# Patient Record
Sex: Male | Born: 1975 | Race: White | Hispanic: No | Marital: Married | Smoking: Former smoker
Health system: Southern US, Academic
[De-identification: ages and names within clinical notes are randomized; demographics above are authoritative.]

## PROBLEM LIST (undated history)

## (undated) ENCOUNTER — Encounter (HOSPITAL_COMMUNITY): Payer: Self-pay

## (undated) ENCOUNTER — Ambulatory Visit (HOSPITAL_COMMUNITY): Payer: Self-pay

## (undated) DIAGNOSIS — C8190 Hodgkin lymphoma, unspecified, unspecified site: Secondary | ICD-10-CM

## (undated) HISTORY — PX: HX LAP CHOLECYSTECTOMY: SHX56

## (undated) HISTORY — DX: Hodgkin lymphoma, unspecified, unspecified site (CMS HCC): C81.90

## (undated) HISTORY — PX: MEDIPORT INSERTION, SINGLE: SHX2027

## (undated) HISTORY — PX: FINGER SURGERY: SHX640

## (undated) SURGERY — IR INFUSAPORT PLACEMENT
Laterality: Right

---

## 1998-10-18 ENCOUNTER — Ambulatory Visit (HOSPITAL_COMMUNITY): Payer: Self-pay | Admitting: EXTERNAL

## 2020-01-12 ENCOUNTER — Ambulatory Visit: Payer: 59

## 2020-01-29 ENCOUNTER — Ambulatory Visit (HOSPITAL_BASED_OUTPATIENT_CLINIC_OR_DEPARTMENT_OTHER): Payer: 59

## 2020-01-29 ENCOUNTER — Ambulatory Visit: Payer: 59 | Attending: Orthopaedic Surgery | Admitting: Orthopaedic Surgery

## 2020-01-29 ENCOUNTER — Encounter (INDEPENDENT_AMBULATORY_CARE_PROVIDER_SITE_OTHER): Payer: Self-pay | Admitting: Orthopaedic Surgery

## 2020-01-29 ENCOUNTER — Other Ambulatory Visit: Payer: Self-pay

## 2020-01-29 VITALS — BP 138/92 | HR 86 | Temp 98.1°F | Ht 69.0 in | Wt 200.2 lb

## 2020-01-29 DIAGNOSIS — M545 Low back pain, unspecified: Secondary | ICD-10-CM

## 2020-01-29 DIAGNOSIS — M4316 Spondylolisthesis, lumbar region: Secondary | ICD-10-CM

## 2020-01-29 DIAGNOSIS — M5416 Radiculopathy, lumbar region: Secondary | ICD-10-CM

## 2020-01-30 NOTE — H&P (Signed)
PATIENT NAME: [name] NUMBER:  [id number]  DATE OF SERVICE: 01/29/2020  DATE OF BIRTH:  [birth year]    HISTORY AND PHYSICAL    CHIEF COMPLAINT:  Low back pain.    HISTORY OF PRESENT ILLNESS:  Jason Huffman is a 44 year old male with no significant past medical history who presents with severe low back pain that started on December 13, 2019. He remembers an event where he was lifting a tire to put back put back into his truck when his pain started.  When he was walking, he felt a popping sensation in his back and started to have sharp and excruciating pain in his lower back radiating into his right hip and down into his right shin skipping the anterior aspect of his knee. He was also having significant weakness and was limited by the pain and could not walk for 5 days. He went into the Emergency Department where he was given pain medicine, which helped his symptoms minimally and was sent home.  Over the course of the next 6 weeks his pain slowly improved and today his pain is significantly improved and has gone from a 10/10 to 3/10 on the pain scale.  He started taking his Neurontin at higher doses which significantly helped his pain and his numbness and tingling in his right leg.  He has also been stretching and trying to be as active as possible and started undergoing physical therapy, his first session which he says helped him significantly.  His symptoms would be made worse with sitting down or lying down for long periods of time.  He would also have difficulty standing for long periods of time as he had significant right leg pain when he would stand especially when he was extending his back.  He had to actually ambulate in his house with in a wheelie chair in order to get around because he was unable to walk significant amounts of distances.  He also received Demerol and Phenergan injection in the Emergency Department, which did not help.  He has not had any epidural steroid injections or nerve root  blocks.  Overall, his symptoms have significantly improved over the course of 6 weeks and he feels that it is at a tolerable level.  He comes in today primarily asking for advice on when he can start back with his normal activities given that he is a very active person that likes to work out as often as possible.    PAST MEDICAL HISTORY:  Notable for bacterial meningitis in around 2010-2011 treated with the IV antibiotics with no deficits, gastroesophageal reflux disease.    PAST SURGICAL HISTORY:  Cholecystectomy in 2013.  No problems with anesthesia.    MEDICATIONS:  1. Gabapentin.   2. Flexeril.   3. Prilosec 20 mg daily.    ALLERGIES:  No known drug allergies.    SOCIAL HISTORY:  Scientist, forensic, Engineer, technical sales, and Dealer.  He last worked on December 13, 2019.  He has been unable to work since. He is trying to get back to work actively.  Denies any tobacco use.  Denies any alcohol use.    FAMILY HISTORY:  Positive for back problems in his father.  Positive for back surgery in 8 family members.  Father had heart disease, CHF, open-heart surgery, quintuple bypass, MI x2, cancer.  Mother had diabetes, heart disease, stroke, high blood pressure, bilateral below-knee amputations, CHF, open-heart surgery, and triple bypass, MI x4.    REVIEW OF SYSTEMS:  Positive for right leg numbness and trouble beginning to urinae and beginning a bowel movement due to the pain since his injury on December 13, 2019, otherwise negative other than what was mentioned in the HPI.    PHYSICAL EXAMINATION:  Vitals:  BP is 138/92, pulse is 86, temperature is 36.7 degrees Celsius, weight is 90.8, height is 5 feet 9 inches.  General:  Resting in exam room chair, in no acute distress.  HEENT:  Normocephalic, atraumatic.  Respiratory:  Nonlabored respirations on exam.  Abdomen:  Soft, nontender, nondistended.  MSK exam:  Spine exam, gait is normal on exam.  He is able to perform tiptoe walking, heel walking, and tandem gait with no issues.  Cervical  and lumbar range of motion are full, 5/5 strength bilaterally to deltoid, biceps, triceps, wrist extension, finger flexion, and hand intrinsic muscles.  Sensation is intact to light touch in the C5 and T1 distribution, 5/5 strength bilaterally to hip flexion, knee extension, ankle dorsiflexion, great toe extension, ankle plantarflexion.  Sensation is intact to light touch in an L2-S1 distribution.  Biceps, triceps, brachioradialis reflexes are 2+ on exam.  Negative Hoffmann.  Negative scapulohumeral reflexes bilaterally.  Patellar and Achilles reflexes are 2+.  Negative clonus.  Babinski is downgoing bilaterally, nontender to palpation over all of the cervical, thoracic and lumbar regions of the spine.    IMAGING:  MRI of the lumbosacral spine was reviewed today with Dr. Layne Benton.  Per our interpretation there is a herniated nucleus pulposus at the L3-L4 disk impinging on the right L4 traversing nerve root as a posterior lateral herniated disk.  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 X-rays were also reviewed which showed no acute fracture dislocation noted.    ASSESSMENT AND PLAN:  Jason Huffman is a 44 year old male who presents with a right L4 radiculopathy type history, physical examination, and imaging findings. Given his resolution of his symptoms, at this time, conservative management is indicated.  We will continue his physical therapy as well as his medication regimen and advised him to gradually increase his activity as tolerated.  We also educated him on red flag signs and symptoms to come in right away if he were to experience any bowel or bladder incontinence.  We do believe that this will get better over time as it has already started to improve significantly. The L5-S1 isthmic spondylolisthesis has likely been there for a very long time and the patient was advised that if this were to start bothering him in the  future, that there would be options for treating out as well.  Otherwise, the patient can follow up on an as-needed basis and continue the current management that he has.  He was instructed to gradually increase his activity as tolerated.  The patient is in agreement with the plan.  All questions and concerns were answered.      Jason Delay, MD    I saw and examined the patient.  I reviewed the resident's note.  I agree with the findings and plan of care as documented in the resident's note.  Any exceptions/additions are edited/noted.     Terald Sleeper, MD  Professor   [facility]           CC:   Adriana Mccallum, MD   Fax: [phone]       DD:  01/29/2020 18:44:01  DT:  01/30/2020 06:41:20  RO  D#:  [id number]

## 2021-07-27 ENCOUNTER — Encounter (HOSPITAL_BASED_OUTPATIENT_CLINIC_OR_DEPARTMENT_OTHER): Payer: Self-pay | Admitting: Internal Medicine

## 2021-08-04 ENCOUNTER — Other Ambulatory Visit (HOSPITAL_BASED_OUTPATIENT_CLINIC_OR_DEPARTMENT_OTHER): Payer: Self-pay | Admitting: Internal Medicine

## 2021-08-04 ENCOUNTER — Inpatient Hospital Stay (HOSPITAL_BASED_OUTPATIENT_CLINIC_OR_DEPARTMENT_OTHER)
Admission: RE | Admit: 2021-08-04 | Discharge: 2021-08-04 | Disposition: A | Discharge status: Home / Self Care | Payer: 59 | Source: Ambulatory Visit

## 2021-08-04 ENCOUNTER — Other Ambulatory Visit: Payer: Self-pay

## 2021-08-04 ENCOUNTER — Ambulatory Visit (HOSPITAL_BASED_OUTPATIENT_CLINIC_OR_DEPARTMENT_OTHER): Payer: 59

## 2021-08-04 ENCOUNTER — Ambulatory Visit: Payer: 59 | Attending: Internal Medicine | Admitting: Internal Medicine

## 2021-08-04 ENCOUNTER — Encounter (HOSPITAL_BASED_OUTPATIENT_CLINIC_OR_DEPARTMENT_OTHER): Payer: Self-pay | Admitting: Internal Medicine

## 2021-08-04 VITALS — BP 126/84 | HR 70 | Temp 97.6°F | Ht 69.0 in | Wt 201.1 lb

## 2021-08-04 DIAGNOSIS — Z8571 Personal history of Hodgkin lymphoma: Secondary | ICD-10-CM | POA: Insufficient documentation

## 2021-08-04 DIAGNOSIS — R0789 Other chest pain: Secondary | ICD-10-CM | POA: Insufficient documentation

## 2021-08-04 DIAGNOSIS — C8190 Hodgkin lymphoma, unspecified, unspecified site: Secondary | ICD-10-CM

## 2021-08-04 DIAGNOSIS — L299 Pruritus, unspecified: Secondary | ICD-10-CM | POA: Insufficient documentation

## 2021-08-04 DIAGNOSIS — Z7952 Long term (current) use of systemic steroids: Secondary | ICD-10-CM | POA: Insufficient documentation

## 2021-08-04 DIAGNOSIS — Z87891 Personal history of nicotine dependence: Secondary | ICD-10-CM | POA: Insufficient documentation

## 2021-08-04 LAB — COMPREHENSIVE METABOLIC PANEL, NON-FASTING
ALBUMIN: 3.8 g/dL (ref 3.5–5.0)
ALKALINE PHOSPHATASE: 97 U/L (ref 45–115)
ALT (SGPT): 14 U/L (ref 10–55)
ANION GAP: 10 mmol/L (ref 4–13)
AST (SGOT): 21 U/L (ref 8–45)
BILIRUBIN TOTAL: 0.3 mg/dL (ref 0.3–1.3)
BUN/CREA RATIO: 13 (ref 6–22)
BUN: 11 mg/dL (ref 8–25)
CALCIUM: 9.5 mg/dL (ref 8.5–10.0)
CHLORIDE: 103 mmol/L (ref 96–111)
CO2 TOTAL: 23 mmol/L (ref 22–30)
CREATININE: 0.88 mg/dL (ref 0.75–1.35)
ESTIMATED GFR: >90 mL/min/BSA (ref >=60)
GLUCOSE: 91 mg/dL (ref 65–125)
POTASSIUM: 4.1 mmol/L (ref 3.5–5.1)
PROTEIN TOTAL: 7.5 g/dL (ref 6.0–7.9)
SODIUM: 136 mmol/L (ref 136–145)

## 2021-08-04 LAB — CBC WITH DIFF
BASOPHIL #: <0.1 10*3/uL (ref <=0.20)
BASOPHIL %: 1 %
EOSINOPHIL #: 0.63 10*3/uL — ABNORMAL HIGH (ref <=0.50)
EOSINOPHIL %: 5 %
HCT: 42.4 % (ref 38.9–52.0)
HGB: 14.2 g/dL (ref 13.4–17.5)
IMMATURE GRANULOCYTE #: <0.1 10*3/uL (ref <0.10)
IMMATURE GRANULOCYTE %: 0 % (ref 0–1)
LYMPHOCYTE #: 0.54 10*3/uL — ABNORMAL LOW (ref 1.00–4.80)
LYMPHOCYTE %: 5 %
MCH: 27.3 pg (ref 26.0–32.0)
MCHC: 33.5 g/dL (ref 31.0–35.5)
MCV: 81.5 fL (ref 78.0–100.0)
MONOCYTE #: 0.75 10*3/uL (ref 0.20–1.10)
MONOCYTE %: 6 %
NEUTROPHIL #: 9.84 10*3/uL — ABNORMAL HIGH (ref 1.50–7.70)
NEUTROPHIL %: 83 %
PLATELETS: 308 10*3/uL (ref 150–400)
RBC: 5.2 10*6/uL (ref 4.50–6.10)
RDW-CV: 13.4 % (ref 11.5–15.5)
WBC: 11.9 10*3/uL — ABNORMAL HIGH (ref 3.7–11.0)

## 2021-08-04 LAB — LDH: LDH: 238 U/L — ABNORMAL HIGH (ref 125–220)

## 2021-08-04 LAB — HIV1/HIV2 SCREEN, COMBINED ANTIGEN AND ANTIBODY: HIV SCREEN, COMBINED ANTIGEN & ANTIBODY: NEGATIVE

## 2021-08-04 LAB — PLATELETS AND ANC CANCER CENTER
NEUTROPHILS #: 9.84 10*3/uL — ABNORMAL HIGH (ref 1.50–7.70)
PLATELET COUNT: 308 10*3/uL (ref 150–400)

## 2021-08-04 LAB — HEPATITIS B CORE ANTIBODY: HBV CORE TOTAL ANTIBODIES: NEGATIVE

## 2021-08-04 LAB — HEPATITIS B SURFACE ANTIBODY: HBV SURFACE ANTIBODY QUANTITATIVE: 130 m[IU]/mL — ABNORMAL HIGH (ref <8)

## 2021-08-04 LAB — HEPATITIS B SURFACE ANTIGEN: HBV SURFACE ANTIGEN QUALITATIVE: NEGATIVE

## 2021-08-04 LAB — SEDIMENTATION RATE: ERYTHROCYTE SEDIMENTATION RATE (ESR): 11 mm/hr (ref 0–15)

## 2021-08-04 LAB — HEPATITIS C ANTIBODY SCREEN WITH REFLEX TO HCV PCR: HCV ANTIBODY QUALITATIVE: NEGATIVE

## 2021-08-04 MED ORDER — DEXAMETHASONE 4 MG TABLET
4.0000 mg | ORAL_TABLET | Freq: Every day | ORAL | 0 refills | Status: DC
Start: 2021-08-04 — End: 2021-08-18

## 2021-08-04 NOTE — Nurses Notes (Signed)
New Patient Visit - [facility]      Diagnosis:  Hodgkin's [place] disease specific written information provided and verbally reviewed.  Patient assessed for barriers to learning, language, learning preference, and teaching needs.      Patient completed psychosocial distress screening using the Enhanced NCCN Distress Thermometer (DT) Tool and self-reported an overall score of 3/10. Self-reported scores of 6 and above, including any identified practical, family or emotional concerns, were sent to the [facility] [employer]' pool for review and management. Any concerns in regards to physical symptoms or treatment decisions were sent to the physician for review via [place].     [place] telephone triage brochure and magnet provided with verbal instructions on how to access this support should the patient develop symptoms, side effects and when necessary go to a local emergency room.    Verbally reviewed that appointments may be rescheduled but missed appointments will be followed up with a phone call or letter from our clinic. Provided a written copy of [facility]'s and [facility]'s appointment policies.    Oriented to the [facility]. Verbally reviewed support services offered at the [facility]: home medical supply service, chaplain, psychosocial/supportive care clinic, registered dietician, social workers, pharmacy specialists, financial counselors.        Caregiver Education  Patient has a Caregiver: Yes    Patient Caregiver:  Spouse Marye Round)    Caregiver phone number: [phone]    Patient gives verbal permission to the [facility] clinic staff to discuss medical information with the caregiver: yes    Caregiver has been assessed for barriers to learning, language, learning preference, and teaching needs.      [facility] Caring for the Caregiver and When Someone You Love Is Being Treated for Cancer was verbally  reviewed and provided a copy to caregiver-no, no treatment at this time.  Will get treatment at [facility] was engaged during the caregiver education.    Feedback received from caregiver was a verbal understanding of treatment plan.    Patient to be referred to [facility] for remainder of workup and treatment.  Patient and wife verbalized understanding of plan.       Adora Fridge, RN  08/04/2021, 13:49

## 2021-08-04 NOTE — Cancer Center Note (Signed)
[facility] Patient History and Physical      Date: 08/04/2021  Name: Jason Huffman  MRN: [id number]  Referring Physician: System, Referring Not In  Primary Care Provider: Adriana Mccallum, DO    Reason for visit/consultation New Patient      History of Present Illness:  Information Obtained from: patient and spouse  Pt is a 46 year old man who presents for evaluation of Hodgkin Lymphoma.  He noted that for the past year he hasn't been feeling right, that his energy hasn't been good.  He also started itching, and saw multiple doctors about the itching and received medications and creams for it.  In the fall he developed intermittent chest pain, which worried him because both his parents died young of heart disease. In November started his evaluation and ultimately had a CT chest, which showed a large mediastinal mass, 12 cm, with associated lymphadenopathy.  He had a biopsy done, which showed classical Hodgkin Lymphoma.  He denies fevers, chills, night sweats.  His main complaints are fatigue, itching, and intermittent chest pain, which is sharp and positional.    Past Medical History  Past Medical History:   Diagnosis Date   . Hodgkin lymphoma (CMS HCC)            No Known Allergies  Current Outpatient Medications   Medication Sig   . busPIRone (BUSPAR) 5 mg Oral Tablet Take 1 Tablet (5 mg total) by mouth Twice daily   . cyclobenzaprine (FLEXERIL) 10 mg Oral Tablet Take 1 Tablet (10 mg total) by mouth Three times a day   . dexAMETHasone (DECADRON) 4 mg Oral Tablet Take 1 Tablet (4 mg total) by mouth Once a day for 7 days   . gabapentin (NEURONTIN) 300 mg Oral Capsule Take 300 mg by mouth (Patient not taking: Reported on 08/04/2021)   . hydrOXYzine HCL (ATARAX) 25 mg Oral Tablet Take 1 Tablet (25 mg total) by mouth Three times a day   . Ibuprofen (MOTRIN) 600 mg Oral Tablet Take 1 Tablet (600 mg total) by mouth Four times a day as needed for Pain   . omeprazole (PRILOSEC) 20 mg Oral Capsule, Delayed  Release(E.C.) Take 1 Capsule (20 mg total) by mouth Once a day   . zolpidem (AMBIEN) 10 mg Oral Tablet Take 1 Tablet (10 mg total) by mouth Every night       Family History  Family Medical History:     Problem Relation (Age of Onset)    Coronary Artery Disease Mother, Father          Social History  Occupation:   Social History     Occupational History   . Occupation: School bus driver    Hometown: [place] [address]   reports that he quit smoking about 16 years ago. His smoking use included cigarettes. He has never used smokeless tobacco. He reports that he does not drink alcohol and does not use drugs.  ROS: Other than ROS in the HPI, all other systems were negative.    Physical Examination:  Most Recent [facility] Office Visit from 08/04/2021 in Hematology/Oncology, [facility]   Temperature 36.4 C (97.6 F) filed at... 08/04/2021 1047   Heart Rate 70 filed at... 08/04/2021 1047   Respiratory Rate --   BP (Non-Invasive) 126/84 filed at... 08/04/2021 1047   SpO2 100 % filed at... 08/04/2021 1047   Height 1.753 m (_0 ) filed at... 08/04/2021 1047  Weight 91.2 kg (201 lb 1 oz) filed at... 08/04/2021 1047   BMI (Calculated) 29.75 filed at... 08/04/2021 1047   BSA (Calculated) 2.11 filed at... 08/04/2021 1047      ECOG Status: 0 - Fully active, able to carry on all pre-disease performance without restriction.     General: appears in good health  Eyes: Conjunctiva clear.  Neck: no adenopathy  Lungs: breathing comfortably  Cardiovascular: Regular rate and rhythm  Abdomen: non-distended  Extremities: no cyanosis or edema  Skin: excoriations  Neurologic: grossly normal  Lymphatics: No nodes appreciated  Psychiatric: AOx3  Access:  No current access site    Labs  Results for orders placed or performed during the hospital encounter of 08/04/21 (from the past 36 hour(s))   COMPREHENSIVE METABOLIC PANEL, NON-FASTING   Result Value Ref Range    SODIUM 136 136 - 145 mmol/L    POTASSIUM 4.1  3.5 - 5.1 mmol/L    CHLORIDE 103 96 - 111 mmol/L    CO2 TOTAL 23 22 - 30 mmol/L    ANION GAP 10 4 - 13 mmol/L    BUN 11 8 - 25 mg/dL    CREATININE 0.88 0.75 - 1.35 mg/dL    BUN/CREA RATIO 13 6 - 22    ESTIMATED GFR >90 >=60 mL/min/BSA    ALBUMIN 3.8 3.5 - 5.0 g/dL     CALCIUM 9.5 8.5 - 10.0 mg/dL    GLUCOSE 91 65 - 125 mg/dL    ALKALINE PHOSPHATASE 97 45 - 115 U/L    ALT (SGPT) 14 10 - 55 U/L    AST (SGOT)  21 8 - 45 U/L    BILIRUBIN TOTAL 0.3 0.3 - 1.3 mg/dL    PROTEIN TOTAL 7.5 6.0 - 7.9 g/dL   BLOOD CELL COUNT W/DIFF - CANCER CENTER    Narrative    The following orders were created for panel order BLOOD CELL COUNT W/DIFF - CANCER CENTER.  Procedure                               Abnormality         Status                     ---------                               -----------         ------                     CBC WITH ULAG[[id number]]                Abnormal            Final result               PLATELETS AND ANC CANCER.Marland KitchenMarland Kitchen[[id number]]  Abnormal            Final result                 Please view results for these tests on the individual orders.   LDH   Result Value Ref Range    LDH 238 (H) 125 - 220 U/L   SEDIMENTATION RATE   Result Value Ref Range    ERYTHROCYTE SEDIMENTATION RATE (ESR) 11 0 - 15 mm/hr   HIV1/HIV2 SCREEN, COMBINED ANTIGEN AND ANTIBODY   Result  Value Ref Range    HIV SCREEN, COMBINED ANTIGEN & ANTIBODY Negative Negative   Hepatitis C Antibody   Result Value Ref Range    HCV ANTIBODY QUALITATIVE Negative Negative   Hepatitis B Surface Antibody   Result Value Ref Range    HBV SURFACE ANTIBODY QUANTITATIVE 130 (H) <8 mIU/mL    Hepatitis B Surface Antigen   Result Value Ref Range    HBV SURFACE ANTIGEN QUALITATIVE Negative Negative   HEPATITIS B CORE ANTIBODY   Result Value Ref Range    HBV CORE TOTAL ANTIBODIES Negative Negative   CBC WITH DIFF   Result Value Ref Range    WBC 11.9 (H) 3.7 - 11.0 x10^3/uL    RBC 5.20 4.50 - 6.10 x10^6/uL    HGB 14.2 13.4 - 17.5 g/dL    HCT 42.4 38.9 - 52.0 %    MCV  81.5 78.0 - 100.0 fL    MCH 27.3 26.0 - 32.0 pg    MCHC 33.5 31.0 - 35.5 g/dL    RDW-CV 13.4 11.5 - 15.5 %    PLATELETS 308 150 - 400 x10^3/uL    NEUTROPHIL % 83 %    LYMPHOCYTE % 5 %    MONOCYTE % 6 %    EOSINOPHIL % 5 %    BASOPHIL % 1 %    NEUTROPHIL # 9.84 (H) 1.50 - 7.70 x10^3/uL    LYMPHOCYTE # 0.54 (L) 1.00 - 4.80 x10^3/uL    MONOCYTE # 0.75 0.20 - 1.10 x10^3/uL    EOSINOPHIL # 0.63 (H) <=0.50 x10^3/uL    BASOPHIL # <0.10 <=0.20 x10^3/uL    IMMATURE GRANULOCYTE % 0 0 - 1 %    IMMATURE GRANULOCYTE # <0.10 <0.10 x10^3/uL    Narrative    Mean Platelet Volume - Not Reported  Platelet Count -Analyzer flag indicated platelet clumps- actual count may be higher.   PLATELETS AND ANC CANCER CENTER   Result Value Ref Range    NEUTROPHILS # 9.84 (H) 1.50 - 7.70 x10^3/uL    PLATELET COUNT 308 150 - 400 x10^3/uL     Pathology:  Slide reviewed at [facility]: pending      Radiology  Outside CT chest reviewed by me, large anterior mediastinal mass    Assessment/Plan:   Pt is a 46 year old man who presents for evaluation of Hodgkin lymphoma.    1. Hodgkin lymphoma: Today we discussed a normal healthy blood system and the derangements that happen in HL.  His path has not been received here yet for review, but per outside records it is classical HL.  He will need echo and PFT's, which he can get locally.  He appears to have stage IIAX disease, but with his large mass and chest pain that sounds like pericardial invasion, I would treat with 6 cycles of PET-adapted ABVD, followed by radiation to site of bulk.  If his PFT's are abnormal, he can get 6 cycles of AAVD instead.    2. Chest pain: Seems like pericarditis from local mass.  Prescribed dexamethasone 4 mg po daily x 7 days until he can get started on treatment.    3. Itching: Due to underlying HL, should improve with treatment.    This was a 60 minute visit, more than half of which was spent in counseling on HL.    RTC prn.      Katheren Puller, MD    CC:  Adriana Mccallum, DO  [place]  [id number]    No primary care provider on file.

## 2021-08-05 ENCOUNTER — Encounter (HOSPITAL_BASED_OUTPATIENT_CLINIC_OR_DEPARTMENT_OTHER): Payer: Self-pay | Admitting: Internal Medicine

## 2021-08-05 ENCOUNTER — Encounter (HOSPITAL_BASED_OUTPATIENT_CLINIC_OR_DEPARTMENT_OTHER): Payer: Self-pay

## 2021-08-05 ENCOUNTER — Other Ambulatory Visit: Payer: 59 | Attending: Hematology & Oncology

## 2021-08-05 DIAGNOSIS — Z710 Person encountering health services to consult on behalf of another person: Secondary | ICD-10-CM

## 2021-08-05 DIAGNOSIS — C8179 Other classical Hodgkin lymphoma, extranodal and solid organ sites: Secondary | ICD-10-CM

## 2021-08-05 NOTE — Progress Notes (Signed)
Late entry for 08/04/21:  MSW responding to patient's Enhanced NCCN distress tool screening performed at visit on date 08/04/21. Patient self-reported a score of 3. Identified treatment decisions and finances as concern. Patient was referred to Oncology Social Work. Met with pt/wife at request of MD Ross.  Provided information about social work services available as well as information from LLS to review.  Pt will receive treatment closer to home and did not have additional concerns or questions at this time.  Patient provided with MSW contact information for any future needs.

## 2021-08-07 ENCOUNTER — Other Ambulatory Visit (HOSPITAL_BASED_OUTPATIENT_CLINIC_OR_DEPARTMENT_OTHER): Payer: Self-pay | Admitting: Internal Medicine

## 2021-08-07 ENCOUNTER — Other Ambulatory Visit (HOSPITAL_COMMUNITY): Payer: Self-pay

## 2021-08-07 DIAGNOSIS — C8190 Hodgkin lymphoma, unspecified, unspecified site: Secondary | ICD-10-CM | POA: Insufficient documentation

## 2021-08-08 ENCOUNTER — Other Ambulatory Visit (HOSPITAL_BASED_OUTPATIENT_CLINIC_OR_DEPARTMENT_OTHER): Payer: Self-pay | Admitting: Internal Medicine

## 2021-08-08 NOTE — Progress Notes (Signed)
MULTIDISCIPLINARY MLS TUMOR BOARD DISCUSSION:    PATIENT:  Jason Huffman  MRN:  [id number]  DOB:   Jan 04, 1976  AGE:   46 y.o.  DATE:  08/08/2021    REFERRING PROVIDER: No ref. provider found  PCP: Adriana Mccallum, DO    PRESENTER: Katheren Puller, MD  TYPE OF PRESENTATION: Prospective  PATHOLOGY REVIEWED AT [facility]?: No  RADIOLOGY REVIEWED AT [facility]?:  No  NATIONAL GUIDELINES DISCUSSED?: Yes; NCCN    DIAGNOSIS: Hodgkin Lymphoma  DIAGNOSIS DATE: February 2023  DIAGNOSIS LOCATION:  Outside Facility  DIAGNOSIS METHOD: Biopsy  STAGE: Stage IIAX  RECURRENT/ RELAPSE/ REFRACTORY: No    FAMILY HISTORY?: Not Significant  GENETIC TESTING?: No  PROGNOSTIC INDICATORS: Yes    CLINICAL TRIAL PARTICIPATION: No  ELIGIBLE CLINICAL TRIALS: No    NEED FOR PALLIATIVE CARE?:  No  PSYCHOSOCIAL CONCERNS?:   No  REHABILITATION CONCERNS?:   No  NUTRITIONAL CONCERNS?: No  DENTAL/ORAL SURGERY?: No    PATIENT NAVIGATOR DISCUSSED OUTREACH ACTIVITIES/PROGRAMS?: Yes  PATIENT NAVIGATOR PROVIDED EDUCATIONAL MATERIAL TO PATIENT?: Yes    THE PATIENT'S MOST RECENT CLINICAL INFORMATION, IMAGING, AND PATHOLOGY WAS DISCUSSED TODAY AT [facility] MULTIDISCIPLINARY MLS TUMOR BOARD BY THE MEDICAL ONCOLOGY, RADIATION ONCOLOGY, PATHOLOGY, PHARMACY, DATA, SOCIAL SERVICES, GENETIC, REHAB, AND NURSING SERVICES.  RECOMMENDATIONS BASED ON TODAY'S TUMOR BOARD:   -Obtain echo and PFT  -Complete 6C of ABVD  -Refer to rad-onc for RT to site of bulk    York Pellant 08/08/2021 14:57  Oncology Tumor Board Coordinator

## 2021-08-11 ENCOUNTER — Telehealth (HOSPITAL_COMMUNITY): Payer: Self-pay

## 2021-08-11 NOTE — Patient Instructions (Signed)
Interventional Radiology has contacted the patient and given the following instructions:    You have an appointment in Interventional Radiology on 08/21/21 at 6:30am for port placement.  Please arrive 15 minutes prior to your appointment time and check in at the Registration Desk in the [place] [facility].     Your instructions are:  Fast for 8 hours prior to your arrival. You may have clear liquids up until 2 hours prior to procedure.  Morning medications may be taken with water.  Medication instructions: None, patient not on diabetic medications or blood thinners. Continue all prescription medications.  You will be given moderate sedation. You must have a driver available for transportation at discharge.     Please call Interventional Radiology at [phone] option [phone] if you have any further questions or concerns.

## 2021-08-12 ENCOUNTER — Encounter (HOSPITAL_BASED_OUTPATIENT_CLINIC_OR_DEPARTMENT_OTHER): Payer: Self-pay | Admitting: Internal Medicine

## 2021-08-12 ENCOUNTER — Other Ambulatory Visit (HOSPITAL_BASED_OUTPATIENT_CLINIC_OR_DEPARTMENT_OTHER): Payer: Self-pay | Admitting: Internal Medicine

## 2021-08-12 MED ORDER — DEXAMETHASONE 4 MG TABLET
4.0000 mg | ORAL_TABLET | Freq: Every day | ORAL | 0 refills | Status: DC
Start: 2021-08-12 — End: 2021-10-17

## 2021-08-12 NOTE — Telephone Encounter (Signed)
decadron

## 2021-08-15 ENCOUNTER — Inpatient Hospital Stay
Admission: RE | Admit: 2021-08-15 | Discharge: 2021-08-15 | Disposition: A | Discharge status: Home / Self Care | Payer: 59 | Source: Ambulatory Visit | Attending: Cardiovascular Disease | Admitting: Cardiovascular Disease

## 2021-08-15 ENCOUNTER — Other Ambulatory Visit (HOSPITAL_BASED_OUTPATIENT_CLINIC_OR_DEPARTMENT_OTHER): Payer: Self-pay | Admitting: Internal Medicine

## 2021-08-15 ENCOUNTER — Inpatient Hospital Stay (HOSPITAL_BASED_OUTPATIENT_CLINIC_OR_DEPARTMENT_OTHER)
Admission: RE | Admit: 2021-08-15 | Discharge: 2021-08-15 | Disposition: A | Discharge status: Home / Self Care | Payer: 59 | Source: Ambulatory Visit

## 2021-08-15 ENCOUNTER — Other Ambulatory Visit: Payer: Self-pay

## 2021-08-15 DIAGNOSIS — R942 Abnormal results of pulmonary function studies: Secondary | ICD-10-CM

## 2021-08-15 DIAGNOSIS — C8190 Hodgkin lymphoma, unspecified, unspecified site: Secondary | ICD-10-CM

## 2021-08-15 LAB — ONCO-CARDIOLOGY TRANSTHORACIC ECHO
AV LVOT peak gradient: 7 mmHg
AV mean gradient: 4 mmHg
Ao VTI: 24.2 cm
Ao root annulus: 3.5 cm
Ao root annulus: 3.5 cm
Aortic Valve Area by Continuity of VTI: 3.65 cm2
Ascending aorta: 3.5 cm
E wave decelartion time: 253 ms
E/A ratio: 1.1
E/E' ratio: 6.3
LA Volume Index: 21.6 ml/m2
LA volume: 30.5 cm3
LVOT diameter: 2.1 cm
LVOT stroke volume: 88 cm3
Left Ventricular Outflow Tract Peak Velocity: 128 cm/s
MV Peak A Vel: 64.9 cm/s
MV Peak E Vel: 72.8 cm/s
MV stenosis pressure 1/2 time: 74 ms
Pulmonic Valve Acceleration Time: 94 ms
RVDD: 3.7 cm
RVDD: 3.7 cm
TDI: 11.6 cm/s
TDI: 11.6 cm/s

## 2021-08-15 MED ORDER — DEXTROSE 5% IN WATER (D5W) FLUSH BAG - 250 ML
INTRAVENOUS | Status: DC | PRN
Start: 2021-08-15 — End: 2021-08-16

## 2021-08-15 MED ORDER — SODIUM CHLORIDE 0.9 % (FLUSH) INJECTION SYRINGE
2.0000 mL | INJECTION | Freq: Three times a day (TID) | INTRAMUSCULAR | Status: DC
Start: 2021-08-15 — End: 2021-08-16

## 2021-08-15 MED ORDER — SODIUM CHLORIDE 0.9% FLUSH BAG - 250 ML
INTRAVENOUS | Status: AC | PRN
Start: 2021-08-15 — End: ?

## 2021-08-15 MED ORDER — SODIUM CHLORIDE 0.9 % INJECTION SOLUTION
2.0000 mL | INTRAVENOUS | Status: AC
Start: 2021-08-15 — End: 2021-08-15
  Administered 2021-08-15: 2 mL via INTRAVENOUS

## 2021-08-15 MED ORDER — SODIUM CHLORIDE 0.9 % (FLUSH) INJECTION SYRINGE
2.0000 mL | INJECTION | INTRAMUSCULAR | Status: DC | PRN
Start: 2021-08-15 — End: 2021-08-16

## 2021-08-15 MED ORDER — ALBUTEROL SULFATE 2.5 MG/3 ML (0.083 %) SOLUTION FOR NEBULIZATION
2.5000 mg | INHALATION_SOLUTION | Freq: Once | RESPIRATORY_TRACT | Status: AC
Start: 2021-08-15 — End: 2021-08-15
  Administered 2021-08-15: 2.5 mg via RESPIRATORY_TRACT

## 2021-08-17 NOTE — Cancer Center Note (Signed)
Department of Hematology/Oncology  Return Patient Visit      Date: 08/18/2021  Name: Jason Huffman  MRN: [id number]  Referring Physician: No ref. provider found  Primary Care Provider: Adriana Mccallum, DO    Reason for visit/consultation Hodgkins Disease      History of Present Illness:  Information Obtained from: patient and spouse  1. Jason Huffman is a 46 year old man who presents for evaluation of Hodgkin Lymphoma.  He noted that for the past year he hasn't been feeling right, that his energy hasn't been good.  He also started itching, and saw multiple doctors about the itching and received medications and creams for it.  In the fall he developed intermittent chest pain, which worried him because both his parents died young of heart disease. In November started his evaluation and ultimately had a CT chest, which showed a large mediastinal mass, 12 cm, with associated lymphadenopathy.  He had a biopsy done, which showed classical Hodgkin Lymphoma.  He denies fevers, chills, night sweats.  His main complaints are fatigue, itching, and intermittent chest pain, which is sharp and positional.  2. Evalution at [facility] on 08/04/2021 with WBC 11.9, HGB 14.2, PLC 308K, ANC 9.84, ALC 540, ESR 11, creatinine 0.88, LDH 238, albumin 3.8, Hep B immunized, HIV and Hep C negative, TTE on 08/15/21 with EF of 60-65% with normal diastolic function. PFT's with FEV1 of 91% and DLCOunc 94%.     Interval History:  Jason Huffman presents today with his wife. He is overall doing well. He completed the course of steroids and notes that he did have improvement of his pain but still has some slight chest pressure from the mass. No chest pain, palpitations, shortness of breath or cough. No abdominal pain, constipation or diarrhea. No fevers, chills or night sweats. 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       Past Medical History  Past Medical  History:   Diagnosis Date    Hodgkin lymphoma (CMS HCC)            No Known Allergies  Current Outpatient Medications   Medication Sig    [START ON 08/19/2021] allopurinoL (ZYLOPRIM) 300 mg Oral Tablet Take 1 Tablet (300 mg total) by mouth Once a day for 4 days    busPIRone (BUSPAR) 5 mg Oral Tablet Take 1 Tablet (5 mg total) by mouth Twice daily    cyclobenzaprine (FLEXERIL) 10 mg Oral Tablet Take 1 Tablet (10 mg total) by mouth Three times a day    dexAMETHasone (DECADRON) 4 mg Oral Tablet Take 1 Tablet (4 mg total) by mouth Once a day (Patient not taking: Reported on 08/18/2021)    gabapentin (NEURONTIN) 300 mg Oral Capsule Take 300 mg by mouth (Patient not taking: Reported on 08/04/2021)    hydrOXYzine HCL (ATARAX) 25 mg Oral Tablet Take 1 Tablet (25 mg total) by mouth Three times a day    Ibuprofen (MOTRIN) 600 mg Oral Tablet Take 1 Tablet (600 mg total) by mouth Four times a day as needed for Pain    lidocaine-prilocaine (EMLA) 2.5-2.5 % Cream Apply a thick layer directly over port then cover with a protective covering at least 60 min prior to port access.    omeprazole (PRILOSEC) 20 mg Oral Capsule, Delayed Release(E.C.) Take 1 Capsule (20 mg total) by mouth Once a day    ondansetron (ZOFRAN) 8 mg Oral Tablet  Take 1 Tablet (8 mg total) by mouth Every 8 hours as needed for Nausea/Vomiting Indications: prevent nausea and vomiting from cancer chemotherapy    prochlorperazine (COMPAZINE) 10 mg Oral Tablet Take 1 Tablet (10 mg total) by mouth Every 6 hours as needed for Nausea/Vomiting Indications: prevent nausea and vomiting from cancer chemotherapy    sennosides-docusate sodium (SENNA WITH DOCUSATE SODIUM) 8.6-50 mg Oral Tablet Take 1 Tablet by mouth Every evening Indications: constipation    zolpidem (AMBIEN) 10 mg Oral Tablet Take 1 Tablet (10 mg total) by mouth Every night       Family History  Family Medical History:     Problem Relation (Age of Onset)    Coronary Artery Disease Mother, Father           Social History  Occupation:   Social History     Occupational History    Occupation: School bus driver    Hometown: [place] [address]   reports that he quit smoking about 16 years ago. His smoking use included cigarettes. He has never used smokeless tobacco. He reports that he does not drink alcohol and does not use drugs.  ROS: Other than ROS in the HPI, all other systems were negative.    Physical Examination:  Most Recent Vitals    [place] Office Visit from 08/18/2021 in Hematology/Oncology, [place]   Temperature 36.3 C (97.4 F) filed at... 08/18/2021 1239   Heart Rate 75 filed at... 08/18/2021 1239   Respiratory Rate --   BP (Non-Invasive) 124/82 filed at... 08/18/2021 1239   SpO2 98 %  [RA] filed at... 08/18/2021 1239   Height 1.77 m (5' 9.69") filed at... 08/18/2021 1239   Weight 91.9 kg (202 lb 9.6 oz) filed at... 08/18/2021 1239   BMI (Calculated) 29.4 filed at... 08/18/2021 1239   BSA (Calculated) 2.13 filed at... 08/18/2021 1239  ECOG Status: 0 - Fully active, able to carry on all pre-disease performance without restriction.     General: appears in good health  Eyes: Conjunctiva clear.  Neck: no adenopathy  Lungs: breathing comfortably  Cardiovascular: Regular rate and rhythm  Abdomen: non-distended  Extremities: no cyanosis or edema  Skin: warm and dry  Neurologic: grossly normal  Lymphatics: No nodes appreciated  Psychiatric: AOx3  Access:  No current access site    Labs  Results for orders placed or performed during the hospital encounter of 08/18/21 (from the past 36 hour(s))   BLOOD CELL COUNT W/DIFF - CANCER CENTER    Narrative    The following orders were created for panel order BLOOD CELL COUNT W/DIFF - CANCER CENTER.  Procedure                               Abnormality         Status                     ---------                               -----------         ------                     CBC WITH ZESP[[id number]]                Abnormal  Final  result               PLATELETS AND ANC CANCER.Marland KitchenMarland Kitchen[[id number]]  Normal              Final result                 Please view results for these tests on the individual orders.   COMPREHENSIVE METABOLIC PANEL, NON-FASTING   Result Value Ref Range    SODIUM 133 (L) 136 - 145 mmol/L    POTASSIUM 3.4 (L) 3.5 - 5.1 mmol/L    CHLORIDE 103 96 - 111 mmol/L    CO2 TOTAL 20 (L) 22 - 30 mmol/L    ANION GAP 10 4 - 13 mmol/L    BUN 19 8 - 25 mg/dL    CREATININE 0.97 0.75 - 1.35 mg/dL    BUN/CREA RATIO 20 6 - 22    ESTIMATED GFR >90 >=60 mL/min/BSA    ALBUMIN 3.4 (L) 3.5 - 5.0 g/dL     CALCIUM 9.0 8.5 - 10.0 mg/dL    GLUCOSE 153 (H) 65 - 125 mg/dL    ALKALINE PHOSPHATASE 52 45 - 115 U/L    ALT (SGPT) 21 10 - 55 U/L    AST (SGOT)  18 8 - 45 U/L    BILIRUBIN TOTAL 0.3 0.3 - 1.3 mg/dL    PROTEIN TOTAL 6.8 6.4 - 8.3 g/dL   CBC WITH DIFF   Result Value Ref Range    WBC 8.7 3.7 - 11.0 x103/uL    RBC 4.78 4.50 - 6.10 x106/uL    HGB 13.2 (L) 13.4 - 17.5 g/dL    HCT 38.6 (L) 38.9 - 52.0 %    MCV 80.8 78.0 - 100.0 fL    MCH 27.6 26.0 - 32.0 pg    MCHC 34.2 31.0 - 35.5 g/dL    RDW-CV 13.8 11.5 - 15.5 %    PLATELETS 276 150 - 400 x103/uL    MPV 8.9 8.7 - 12.5 fL    NEUTROPHIL % 76 %    LYMPHOCYTE % 14 %    MONOCYTE % 8 %    EOSINOPHIL % 2 %    BASOPHIL % 0 %    NEUTROPHIL # 6.60 1.50 - 7.70 x103/uL    LYMPHOCYTE # 1.24 1.00 - 4.80 x103/uL    MONOCYTE # 0.69 0.20 - 1.10 x103/uL    EOSINOPHIL # 0.15 <=0.50 x103/uL    BASOPHIL # <0.10 <=0.20 x103/uL    IMMATURE GRANULOCYTE % 0 0 - 1 %    IMMATURE GRANULOCYTE # <0.10 <0.10 x103/uL   PLATELETS AND ANC CANCER CENTER   Result Value Ref Range    NEUTROPHILS # 6.60 1.50 - 7.70 x103/uL    PLATELET COUNT 276 150 - 400 x103/uL     Pathology:  Slide reviewed at [facility]: pending      Radiology  Outside CT chest reviewed by me, large anterior mediastinal mass    Assessment/Plan:   Jason Huffman is a 46 y.o. male who presents for evaluation of Hodgkin lymphoma.    1. Hodgkin lymphoma: Underlying  diagnosis again discussed with patient. Pathology still being reviewed at [facility] but based on outside pathology diagnosis is consistent with classical Hodgkin Lymphoma. Based on imaging he has Stage IIAX disease, due to the size of his mass and concern for pericardial invasion, Dr. Harrington Challenger is planning to treat with 6 cycles of PET adapted ABVD per RATHL trial follwoed by radiation to site  of bulk. TTE within normal limits. PFT's also WNL. Verbal and written consent obtained today. Patient has no interest in fertility preservation.  Proceed with C1D1 of ABVD    2. Chest pain: Seems like pericarditis from local mass.  Dr. Harrington Challenger prescribed dexamethasone 4 mg po daily x 7 days with improvement    3. Itching: Due to underlying HL, should improve with treatment.    4. IV access: Port scheduled for Thursday.     Disposition: RTC in two weeks for C1D15 of ABVD. Due to work schedule patient prefers future appointments on Tuesdays.     On the day of the encounter, a total of 36 minutes was spent on this patient encounter including review of historical information, examination, documentation and post-visit activities. The time documented excludes procedural time.    Scarlette Shorts, MD  Assistant Professor, Hematology and Oncology  [place] [facility] of Medicine        CC:  Adriana Mccallum, [place] [address]  [place] [address]    No primary care provider on file.

## 2021-08-18 ENCOUNTER — Encounter (HOSPITAL_BASED_OUTPATIENT_CLINIC_OR_DEPARTMENT_OTHER): Payer: Self-pay | Admitting: Hematology & Oncology

## 2021-08-18 ENCOUNTER — Inpatient Hospital Stay (HOSPITAL_BASED_OUTPATIENT_CLINIC_OR_DEPARTMENT_OTHER)
Admission: RE | Admit: 2021-08-18 | Discharge: 2021-08-18 | Disposition: A | Discharge status: Home / Self Care | Payer: 59 | Source: Ambulatory Visit | Attending: Internal Medicine | Admitting: Internal Medicine

## 2021-08-18 ENCOUNTER — Other Ambulatory Visit (HOSPITAL_COMMUNITY): Payer: Self-pay

## 2021-08-18 ENCOUNTER — Other Ambulatory Visit: Payer: Self-pay

## 2021-08-18 ENCOUNTER — Ambulatory Visit (HOSPITAL_BASED_OUTPATIENT_CLINIC_OR_DEPARTMENT_OTHER): Payer: 59 | Admitting: Registered"

## 2021-08-18 ENCOUNTER — Inpatient Hospital Stay (HOSPITAL_BASED_OUTPATIENT_CLINIC_OR_DEPARTMENT_OTHER): Payer: 59

## 2021-08-18 ENCOUNTER — Ambulatory Visit: Payer: 59 | Attending: Internal Medicine | Admitting: Hematology & Oncology

## 2021-08-18 VITALS — BP 124/82 | HR 75 | Temp 97.4°F | Ht 69.69 in | Wt 202.6 lb

## 2021-08-18 DIAGNOSIS — C8192 Hodgkin lymphoma, unspecified, intrathoracic lymph nodes: Secondary | ICD-10-CM | POA: Insufficient documentation

## 2021-08-18 DIAGNOSIS — C8112 Nodular sclerosis classical Hodgkin lymphoma, intrathoracic lymph nodes: Secondary | ICD-10-CM | POA: Insufficient documentation

## 2021-08-18 DIAGNOSIS — Z5111 Encounter for antineoplastic chemotherapy: Secondary | ICD-10-CM | POA: Insufficient documentation

## 2021-08-18 DIAGNOSIS — C8190 Hodgkin lymphoma, unspecified, unspecified site: Secondary | ICD-10-CM

## 2021-08-18 DIAGNOSIS — Z87891 Personal history of nicotine dependence: Secondary | ICD-10-CM | POA: Insufficient documentation

## 2021-08-18 DIAGNOSIS — Z79899 Other long term (current) drug therapy: Secondary | ICD-10-CM | POA: Insufficient documentation

## 2021-08-18 DIAGNOSIS — L299 Pruritus, unspecified: Secondary | ICD-10-CM | POA: Insufficient documentation

## 2021-08-18 DIAGNOSIS — L298 Other pruritus: Secondary | ICD-10-CM | POA: Insufficient documentation

## 2021-08-18 DIAGNOSIS — R0789 Other chest pain: Secondary | ICD-10-CM | POA: Insufficient documentation

## 2021-08-18 LAB — COMPREHENSIVE METABOLIC PANEL, NON-FASTING
ALBUMIN: 3.4 g/dL — ABNORMAL LOW (ref 3.5–5.0)
ALKALINE PHOSPHATASE: 52 U/L (ref 45–115)
ALT (SGPT): 21 U/L (ref 10–55)
ANION GAP: 10 mmol/L (ref 4–13)
AST (SGOT): 18 U/L (ref 8–45)
BILIRUBIN TOTAL: 0.3 mg/dL (ref 0.3–1.3)
BUN/CREA RATIO: 20 (ref 6–22)
BUN: 19 mg/dL (ref 8–25)
CALCIUM: 9 mg/dL (ref 8.5–10.0)
CHLORIDE: 103 mmol/L (ref 96–111)
CO2 TOTAL: 20 mmol/L — ABNORMAL LOW (ref 22–30)
CREATININE: 0.97 mg/dL (ref 0.75–1.35)
ESTIMATED GFR: >90 mL/min/BSA (ref >=60)
GLUCOSE: 153 mg/dL — ABNORMAL HIGH (ref 65–125)
POTASSIUM: 3.4 mmol/L — ABNORMAL LOW (ref 3.5–5.1)
PROTEIN TOTAL: 6.8 g/dL (ref 6.4–8.3)
SODIUM: 133 mmol/L — ABNORMAL LOW (ref 136–145)

## 2021-08-18 LAB — CBC WITH DIFF
BASOPHIL #: <0.1 10*3/uL (ref <=0.20)
BASOPHIL %: 0 %
EOSINOPHIL #: 0.15 10*3/uL (ref <=0.50)
EOSINOPHIL %: 2 %
HCT: 38.6 % — ABNORMAL LOW (ref 38.9–52.0)
HGB: 13.2 g/dL — ABNORMAL LOW (ref 13.4–17.5)
IMMATURE GRANULOCYTE #: <0.1 10*3/uL (ref <0.10)
IMMATURE GRANULOCYTE %: 0 % (ref 0–1)
LYMPHOCYTE #: 1.24 10*3/uL (ref 1.00–4.80)
LYMPHOCYTE %: 14 %
MCH: 27.6 pg (ref 26.0–32.0)
MCHC: 34.2 g/dL (ref 31.0–35.5)
MCV: 80.8 fL (ref 78.0–100.0)
MONOCYTE #: 0.69 10*3/uL (ref 0.20–1.10)
MONOCYTE %: 8 %
MPV: 8.9 fL (ref 8.7–12.5)
NEUTROPHIL #: 6.6 10*3/uL (ref 1.50–7.70)
NEUTROPHIL %: 76 %
PLATELETS: 276 10*3/uL (ref 150–400)
RBC: 4.78 10*6/uL (ref 4.50–6.10)
RDW-CV: 13.8 % (ref 11.5–15.5)
WBC: 8.7 10*3/uL (ref 3.7–11.0)

## 2021-08-18 LAB — PLATELETS AND ANC CANCER CENTER
NEUTROPHILS #: 6.6 10*3/uL (ref 1.50–7.70)
PLATELET COUNT: 276 10*3/uL (ref 150–400)

## 2021-08-18 MED ORDER — DEXTROSE 5% IN WATER (D5W) FLUSH BAG - 250 ML
INTRAVENOUS | Status: AC | PRN
Start: 2021-08-18 — End: ?

## 2021-08-18 MED ORDER — PALONOSETRON 0.25 MG/5 ML INTRAVENOUS SOLUTION
0.2500 mg | Freq: Once | INTRAVENOUS | Status: AC
Start: 2021-08-18 — End: 2021-08-18
  Administered 2021-08-18: 0.25 mg via INTRAVENOUS
  Filled 2021-08-18: qty 5

## 2021-08-18 MED ORDER — FAMOTIDINE (PF) 20 MG/2 ML INTRAVENOUS SOLUTION
20.0000 mg | Freq: Once | INTRAVENOUS | Status: DC | PRN
Start: 2021-08-18 — End: 2021-08-19

## 2021-08-18 MED ORDER — SODIUM CHLORIDE 0.9% FLUSH BAG - 250 ML
INTRAVENOUS | Status: DC | PRN
Start: 2021-08-18 — End: 2021-08-19

## 2021-08-18 MED ORDER — MEPERIDINE (PF) 25 MG/ML INJECTION SOLUTION
12.5000 mg | Freq: Once | INTRAMUSCULAR | Status: DC | PRN
Start: 2021-08-18 — End: 2021-08-19

## 2021-08-18 MED ORDER — DOXORUBICIN 2 MG/ML INTRAVENOUS SOLUTION
25.0000 mg/m2 | Freq: Once | INTRAVENOUS | Status: AC
Start: 2021-08-18 — End: 2021-08-18
  Administered 2021-08-18: 52.8 mg via INTRAVENOUS
  Filled 2021-08-18: qty 26.4

## 2021-08-18 MED ORDER — PROCHLORPERAZINE MALEATE 10 MG TABLET
10.0000 mg | ORAL_TABLET | Freq: Four times a day (QID) | ORAL | 0 refills | Status: DC | PRN
Start: 2021-08-18 — End: 2021-08-18

## 2021-08-18 MED ORDER — ACETAMINOPHEN 325 MG TABLET
650.0000 mg | ORAL_TABLET | Freq: Once | ORAL | Status: AC
Start: 2021-08-18 — End: 2021-08-18
  Administered 2021-08-18: 650 mg via ORAL
  Filled 2021-08-18: qty 2

## 2021-08-18 MED ORDER — ALLOPURINOL 300 MG TABLET
300.0000 mg | ORAL_TABLET | Freq: Every day | ORAL | 0 refills | Status: AC
Start: 2021-08-19 — End: 2021-08-23

## 2021-08-18 MED ORDER — ALLOPURINOL 300 MG TABLET
300.0000 mg | ORAL_TABLET | Freq: Once | ORAL | Status: AC
Start: 2021-08-18 — End: 2021-08-18
  Administered 2021-08-18: 300 mg via ORAL
  Filled 2021-08-18: qty 1

## 2021-08-18 MED ORDER — ALBUTEROL SULFATE HFA 90 MCG/ACTUATION AEROSOL INHALER - RN
2.0000 | Freq: Once | RESPIRATORY_TRACT | Status: DC | PRN
Start: 2021-08-18 — End: 2021-08-19

## 2021-08-18 MED ORDER — VINBLASTINE 1 MG/ML INTRAVENOUS SOLUTION
6.0000 mg/m2 | Freq: Once | INTRAVENOUS | Status: AC
Start: 2021-08-18 — End: 2021-08-18
  Administered 2021-08-18: 0 mg via INTRAVENOUS
  Administered 2021-08-18: 12.7 mg via INTRAVENOUS
  Filled 2021-08-18: qty 12.7

## 2021-08-18 MED ORDER — HYDROCORTISONE SOD SUCCINATE 100 MG/2 ML VIAL WRAPPER
100.0000 mg | Freq: Once | INTRAMUSCULAR | Status: DC | PRN
Start: 2021-08-18 — End: 2021-08-19

## 2021-08-18 MED ORDER — SENNOSIDES 8.6 MG-DOCUSATE SODIUM 50 MG TABLET
1.0000 | ORAL_TABLET | Freq: Every evening | ORAL | 5 refills | Status: DC
Start: 2021-08-18 — End: 2021-09-16

## 2021-08-18 MED ORDER — LORAZEPAM 2 MG/ML INJECTION WRAPPER
1.0000 mg | INTRAMUSCULAR | Status: DC | PRN
Start: 2021-08-18 — End: 2021-08-19

## 2021-08-18 MED ORDER — DEXAMETHASONE 4 MG TABLET
20.0000 mg | ORAL_TABLET | Freq: Once | ORAL | Status: AC
Start: 2021-08-18 — End: 2021-08-18
  Administered 2021-08-18: 20 mg via ORAL
  Filled 2021-08-18: qty 5

## 2021-08-18 MED ORDER — ONDANSETRON HCL 8 MG TABLET
8.0000 mg | ORAL_TABLET | Freq: Three times a day (TID) | ORAL | 5 refills | Status: AC | PRN
Start: 2021-08-18 — End: ?

## 2021-08-18 MED ORDER — DIPHENHYDRAMINE 50 MG/ML INJECTION SOLUTION
50.0000 mg | Freq: Once | INTRAMUSCULAR | Status: DC | PRN
Start: 2021-08-18 — End: 2021-08-19

## 2021-08-18 MED ORDER — PROCHLORPERAZINE MALEATE 10 MG TABLET
10.0000 mg | ORAL_TABLET | Freq: Four times a day (QID) | ORAL | 5 refills | Status: AC | PRN
Start: 2021-08-18 — End: ?

## 2021-08-18 MED ORDER — DIPHENHYDRAMINE 50 MG/ML INJECTION SOLUTION
25.0000 mg | Freq: Once | INTRAMUSCULAR | Status: DC | PRN
Start: 2021-08-18 — End: 2021-08-19

## 2021-08-18 MED ORDER — ALBUTEROL SULFATE 2.5 MG/3 ML (0.083 %) SOLUTION FOR NEBULIZATION
2.5000 mg | INHALATION_SOLUTION | Freq: Once | RESPIRATORY_TRACT | Status: DC | PRN
Start: 2021-08-18 — End: 2021-08-19

## 2021-08-18 MED ORDER — SODIUM CHLORIDE 0.9 % INTRAVENOUS SOLUTION
10.0000 [IU]/m2 | Freq: Once | INTRAVENOUS | Status: AC
Start: 2021-08-18 — End: 2021-08-18
  Administered 2021-08-18: 0 [IU] via INTRAVENOUS
  Administered 2021-08-18: 21 [IU] via INTRAVENOUS
  Filled 2021-08-18: qty 7

## 2021-08-18 MED ORDER — LIDOCAINE-PRILOCAINE 2.5 %-2.5 % TOPICAL CREAM
TOPICAL_CREAM | CUTANEOUS | 2 refills | Status: DC
Start: 2021-08-18 — End: 2021-10-17

## 2021-08-18 MED ORDER — SODIUM CHLORIDE 0.9 % INTRAVENOUS SOLUTION
INTRAVENOUS | Status: DC
Start: 2021-08-18 — End: 2021-08-19

## 2021-08-18 MED ORDER — EPINEPHRINE 1 MG/ML (1 ML) INJECTION SOLUTION
0.3000 mg | Freq: Once | INTRAMUSCULAR | Status: DC | PRN
Start: 2021-08-18 — End: 2021-08-19

## 2021-08-18 MED ORDER — SODIUM CHLORIDE 0.9 % INTRAVENOUS SOLUTION
375.0000 mg/m2 | Freq: Once | INTRAVENOUS | Status: AC
Start: 2021-08-18 — End: 2021-08-18
  Administered 2021-08-18: 791.3 mg via INTRAVENOUS
  Administered 2021-08-18: 0 mg via INTRAVENOUS
  Filled 2021-08-18: qty 60

## 2021-08-18 NOTE — Progress Notes (Addendum)
ABVD Chemotherapy Counseling     Met with Jason Huffman and his wife to provide medication counseling for planned chemotherapy regimen - ABVD for Hodgkin lymphoma. We reviewed logistics of infusions including drawing of labs, pre-medications, and duration for infusion. We also discussed need for pulmonary function testing to assess lung function and need to perform an echocardiogram to assess cardiac function.       The following information was reviewed in the counseling session: brand/generic name the medication, route and schedule of administration, duration of infusions, and potential side effects. Specifically, for bleomycin, we discussed potential for allergic reactions, lung toxicity, and hyperpigmentation. PFT's WNL. In addition we discussed side effects of vinblastine including constipation. We discussed potential for cardiac dysfunction with anthracycline exposure. LVEF WNL. We also discussed red coloring of the doxorubicin infusion and potential for discoloration of bodily fluids for a few days after exposure. Lastly, we discussed effects on fertility.We also discussed myelosuppression after chemotherapy including need to monitor temperature and alert [place] for any fever (100.4 or higher) or feeling unwell that he experiences. We discussed that he may feel more tired when he is anemic and may be more prone to bruising and bleeding after chemo. We discussed potential for nausea and availability of anti-emetics (pre-medication and PRN meds) as well as likelihood for hair loss.The patient was counseled that not every patient experiences every side effect, but to inform the healthcare team of adverse effects that he does experience. The patient was also provided with written education for the regimen.     The patient verbalized understanding of the regimen and had appropriate questions which were answered to satisfaction.     Jason Huffman. Warnell Forester, PharmD, [place]  Oncology Pharmacy Specialist  Pager: [phone]  08/18/2021       Time spent with patient: 30 minutes      Fin Hupp K. Warnell Forester, PharmD, [place]  Oncology Pharmacy Specialist  Pager: [phone]  08/18/2021 2:27 PM

## 2021-08-18 NOTE — Addendum Note (Signed)
Addended by: Lannie Fields on: 08/18/2021 02:31 PM     Modules accepted: Orders

## 2021-08-18 NOTE — Progress Notes (Signed)
PATIENT NAME: Jason Huffman  CHART NUMBER: [id number]  DATE OF BIRTH: 1975-07-27  DATE OF SERVICE: 08/18/2021    MEDICAL NUTRITION THERAPY ASSESSMENT - NEW PT    46 yo male with Hodgkin Disease    Ht: 175.3 cm (69")  Wt:  91.9 kg (202.2#) IBW:  72.7 kg (160#)         %IBW: 126%  Adj BW:  77.5 kg (171#) BMI: 29.3    Estimated Nutrition Needs  Calories:   30 - 35  cal/kg Adj BW =  2325 - 2715 calories/day  Protein:    1.2 - 1.4 gram/kg IBW  = 90 -100 gram/day    Pt starts his day with a cup of coffee and a breakfast bar.  He snacks through the afternoon on chips and water.  The evening meal is "kid-friendly" i.e. tacos, hamburgers, hot dogs, etc".  Protein sources include meats, cheese, yogurt, peanut butter, beans and milk on cereal. He rarely eats fruits or vegetables.  He drinks 1 -2 cups of coffee, ~ 48 oz water and 12 oz root beer.      Intervention: Reviewed the importance of protein and hydration.  Provided a list of soft, moist protein-rich foods.  Addressed the value of fruits & vegetables in the diet.  I encouraged pt to incorporate protein through the middle of the day to help meet his increased needs.  Pt and his wife were receptive to information.  Name and number provided.    Greater than 20 minutes spent interviewing and counseling pt.    Helane Rima, RD, LD  Oncology Dietitian  [facility]

## 2021-08-18 NOTE — Nurses Notes (Addendum)
1130 Patient states that he often needs an ultrasound for PIV placement. He is scheduled later this week to have a port placed. Attempted to place PIV at this time with no success. Other veins palpable are inappropriate for an adriamycin push. Hermine Messick RN  [place], ultrasound nurse. She is in with another patient but will be down to place a PIV when she is done. Informed patient at this time. Hermine Messick RN   1610 22g USG PIV to L FA +blood return. Labs obtained and sent for processing. Pt ambulatory to clinic appt then Infusion in stable condition.Claire Shown, RN  [phone]- Pt arrived for 1st ABVD from clinic. ORders released. [employer], RN  1400: PIV with + blood return, easy flush, pre medicated at this time per eMAR, placed on 30 min wait. Dietician and Pharm D to chairside.  Rush Barer, RN  [phone]: 30 minute wait completed. Blenoxane begun per [facility] via PIV  with good blood return. Chemotherapy policy and procedure followed. Verified during chemotherapy double read that chemotherapy is proper color and consistency, as well as, chemotherapy has not expired.  Verified during double read that treatment sequencing is correct. Chemotherapy reviewed by 2 RN's. Pump rate was visually confirmed with Rush Barer, RN.  Pt has no complaints at this time.  Call bell within reach. Collins Scotland, RN  1524: Blenoxane completed. IV of NSS was established that drips rapidly to gravity. Adriamycin begun via PIV with good blood return. Adriamycin given IV Push via IV line of NSS.   Chemotherapy policy and procedure followed. Verified during chemotherapy double read that chemotherapy is proper color and consistency, as well as, chemotherapy has not expired.  Verified during double read that treatment sequencing is correct. Chemotherapy reviewed by 2 RN's, this nurse and Markus Daft, RN. Collins Scotland, RN  [phone]: Adria push completed without incidence per [facility] time; pt tolerated well. Velban  begun per [facility] via PIV  with good blood return. Chemotherapy policy and procedure followed. Chemotherapy reviewed by 2 RN's. Verified during chemotherapy double read that chemotherapy is proper color and consistency, as well as, chemotherapy has not expired. Verified during double read that treatment sequencing is correct.  Pump rate was visually confirmed with Rush Barer, RN. Pt has no complaints at this time. Call bell within reach.  Collins Scotland, RN  [phone]: Velban completed without incidence per [facility] time; pt tolerated well. Dacarbazine DTIC-Dome begun per [facility] via PIV  with good blood return. Chemotherapy policy and procedure followed. Chemotherapy reviewed by 2 RN's. Verified during chemotherapy double read that chemotherapy is proper color and consistency, as well as, chemotherapy has not expired. Verified during double read that treatment sequencing is correct.  Pump rate was visually confirmed with Rush Barer, RN. Pt has no complaints at this time. Call bell within reach. Collins Scotland, RN  [phone]: Dacarbazine DTIC-Dome completed without incident, tolerated all treatment well today, line flushed, PIV removed with catheter tip intact, gauze and coban applied. Pt ambulatory out of infusion without incident. Rush Barer, RN

## 2021-08-19 NOTE — Nurses Notes (Signed)
Treatment Plan    Diagnosis: Hodgkin's Lymphoma    Patient completed psychosocial distress screening using the Enhanced NCCN Distress Thermometer (DT) Tool and self-reported an overall score of 0/10. Self-reported scores of 6 and above, including any identified practical, family or emotional concerns, were sent to the [employer]' pool for review and management. Any concerns in regards to physical symptoms or treatment decisions were sent to the physician for review via [place].   Patient anxious about starting treatment, but is currently having no stress.    Patient assessed for barriers to learning, language, learning preference, and teaching needs.     Verbally reviewed that appointments may be rescheduled but missed appointments will be followed up with a phone call or letter from our clinic. Provided a written copy of  Cancer Institute's and [facility]'s appointment policies.    Oriented to the [facility]. Verbally reviewed support services offered at the [facility]: home medical supply service, chaplain, psychosocial/supportive care clinic, registered dietician, social workers, pharmacy specialists, financial counselors.    Goals of treatment, side effects, and infertility risk verbally reviewed by physician at the time of signing an informed consent for anticancer treatment. Assessed patient's ability to adhere to prescribed medications and addressed possible barriers to adherence.    A copy was given to the patient and scanned into patient's chart.     Treatment plan verbally reviewed and written information provided on chemotherapy: ABVD  Supportive Medications: Zofran, Compazine, Senna, Allopurinol  Side effects reviewed.  Schedule: Day 1 and 15 every 28 days for 6 cycle.  Planned Duration: 6 cycles starting 08/18/21  The following written information was provided and reviewed:  Marland Kitchen [place] Chem[facility] and You  . ASCO Answers:  Understanding Immunotherapy  . Administering Chemotherapy at Home  . Safe Handling of [place] telephone triage brochure and magnet provided with verbal instructions on how to access this support should the patient develop symptoms, side effects and when necessary go to a local emergency room.      Caregiver Education  Patient has a Caregiver: Yes    Patient Caregiver:  [name] (wife)    Caregiver phone number: [phone]    Patient gives verbal permission to the [facility] clinic staff to discuss medical information with the caregiver: yes    Caregiver has been assessed for barriers to learning, language, learning preference, and teaching needs.      NCI Caring for the Caregiver and When Someone You Love Is Being Treated for Cancer was verbally reviewed and provided a copy to caregiver.    Caregiver was engaged during the caregiver education.    Feedback received from caregiver was a verbal understanding of treatment plan.    Blood work between chemotherapy treatments will be at [facility].     Referral to interventional radiology for infusaport placement, infusaport teaching and provided written information.  Emla cream Rx sent to patient's pharmacy. Scheduled for next week.     Request sent to [facility] specialists for insurance authorization.    Patient was engaged during the treatment plan education. Patient and wife verbalized understanding of treatment plan.     Adora Fridge, RN  08/19/2021, 10:42

## 2021-08-21 ENCOUNTER — Encounter (HOSPITAL_COMMUNITY): Admission: RE | Disposition: A | Discharge status: Home / Self Care | Payer: Self-pay | Source: Ambulatory Visit

## 2021-08-21 ENCOUNTER — Other Ambulatory Visit: Payer: Self-pay

## 2021-08-21 ENCOUNTER — Inpatient Hospital Stay
Admission: RE | Admit: 2021-08-21 | Discharge: 2021-08-21 | Disposition: A | Discharge status: Home / Self Care | Payer: 59 | Source: Ambulatory Visit | Attending: Internal Medicine | Admitting: Internal Medicine

## 2021-08-21 ENCOUNTER — Encounter (HOSPITAL_COMMUNITY): Payer: Self-pay

## 2021-08-21 ENCOUNTER — Ambulatory Visit (HOSPITAL_BASED_OUTPATIENT_CLINIC_OR_DEPARTMENT_OTHER): Payer: Self-pay | Admitting: Internal Medicine

## 2021-08-21 DIAGNOSIS — C8190 Hodgkin lymphoma, unspecified, unspecified site: Secondary | ICD-10-CM | POA: Insufficient documentation

## 2021-08-21 LAB — PTT (PARTIAL THROMBOPLASTIN TIME): APTT: 25.5 seconds (ref 24.2–37.5)

## 2021-08-21 LAB — SURGICAL PATHOLOGY CONSULT

## 2021-08-21 LAB — CBC
HCT: 39.8 % (ref 38.9–52.0)
HGB: 13.5 g/dL (ref 13.4–17.5)
MCH: 27.7 pg (ref 26.0–32.0)
MCHC: 33.9 g/dL (ref 31.0–35.5)
MCV: 81.6 fL (ref 78.0–100.0)
MPV: 9.3 fL (ref 8.7–12.5)
PLATELETS: 186 10*3/uL (ref 150–400)
RBC: 4.88 10*6/uL (ref 4.50–6.10)
RDW-CV: 14.1 % (ref 11.5–15.5)
WBC: 5.3 10*3/uL (ref 3.7–11.0)

## 2021-08-21 LAB — PT/INR
INR: 0.91 (ref 0.80–1.20)
PROTHROMBIN TIME: 10.4 seconds (ref 9.1–13.9)

## 2021-08-21 SURGERY — IR INFUSAPORT PLACEMENT

## 2021-08-21 MED ORDER — MIDAZOLAM 1 MG/ML INJECTION SOLUTION
INTRAMUSCULAR | Status: AC
Start: 2021-08-21 — End: 2021-08-21
  Filled 2021-08-21: qty 2

## 2021-08-21 MED ORDER — FENTANYL (PF) 50 MCG/ML INJECTION SOLUTION
INTRAMUSCULAR | Status: AC
Start: 2021-08-21 — End: 2021-08-21
  Filled 2021-08-21: qty 2

## 2021-08-21 MED ORDER — SODIUM CHLORIDE 0.9 % INTRAVENOUS SOLUTION
INTRAVENOUS | Status: DC
Start: 2021-08-21 — End: 2021-08-21

## 2021-08-21 MED ORDER — DEXTROSE 5 % IN WATER (D5W) INTRAVENOUS SOLUTION
INTRAVENOUS | Status: AC
Start: 2021-08-21 — End: 2021-08-21
  Filled 2021-08-21: qty 20

## 2021-08-21 MED ORDER — DEXTROSE 5 % IN WATER (D5W) INTRAVENOUS SOLUTION
2.0000 g | INTRAVENOUS | Status: AC
Start: 2021-08-21 — End: 2021-08-21
  Administered 2021-08-21: 0 g via INTRAVENOUS
  Administered 2021-08-21: 2 g via INTRAVENOUS

## 2021-08-21 MED ORDER — LIDOCAINE HCL 20 MG/ML (2 %) INJECTION SOLUTION
INTRAMUSCULAR | Status: AC
Start: 2021-08-21 — End: 2021-08-21
  Filled 2021-08-21: qty 60

## 2021-08-21 MED ORDER — MIDAZOLAM (PF) 1 MG/ML INJECTION SOLUTION
Freq: Once | INTRAMUSCULAR | Status: DC | PRN
Start: 2021-08-21 — End: 2021-08-21
  Administered 2021-08-21 (×4): .5 mg via INTRAVENOUS
  Administered 2021-08-21: 1 mg via INTRAVENOUS

## 2021-08-21 MED ORDER — FENTANYL (PF) 50 MCG/ML INJECTION SOLUTION
Freq: Once | INTRAMUSCULAR | Status: DC | PRN
Start: 2021-08-21 — End: 2021-08-21
  Administered 2021-08-21: 25 ug via INTRAVENOUS
  Administered 2021-08-21: 50 ug via INTRAVENOUS
  Administered 2021-08-21 (×3): 25 ug via INTRAVENOUS

## 2021-08-21 MED ORDER — LIDOCAINE HCL 10 MG/ML (1 %) INJECTION SOLUTION
Freq: Once | INTRAMUSCULAR | Status: DC | PRN
Start: 2021-08-21 — End: 2021-08-21
  Administered 2021-08-21: 20 mL via INTRADERMAL

## 2021-08-21 SURGICAL SUPPLY — 5 items
CONV USE 338045 - PACK SURG CSTM RAD (MED SURG SUPPLIES) ×1 IMPLANT
DEVICE FLOW CONTROL FLOWSWITCH 1 WY HI PRESS 1050 PSI (IV TUBING & ACCESSORIES) ×2 IMPLANT
DEVICE FLOW CONTROL FLSWTCH 1_WY HI PRESS 1050 PSI (IV TUBING & ACCESSORIES) ×2
PACK RADIOLOGY ANGIOGRAPHY (MED SURG SUPPLIES) ×1
PORT 8FR DIGNITY MID SIZED PWR_INJECT W/ATTACHED CATH ×1 IMPLANT

## 2021-08-21 NOTE — Brief Op Note (Signed)
[facility]   Brief Post-Interventional Radiology Procedure Note    Patient Name: Jason Huffman Number: [id number]  Date of Birth: [birth year]  Date of Service: 08/21/2021    PROCEDURE: Right chest wall Mediport catheter placement  Attending: Limmie Patricia, MD  Assistant: Lowell Guitar, MD  Preop Diagnosis: Lymphoma  Postop Diagnosis: Same  Anesthesia: Moderate sedation  Estimated Blood Loss: minimal  Specimens Removed: None     FINDINGS/INTERVENTIONS/COMPLICATIONS:   Successful right chest wall Mediport catheter placement via the right internal jugular vein.   No immediate complications  Full dictation to follow.    RECOMMENDATIONS & FOLLOW-UP:   Bedrest for 1 hr  Port is ok for immediate use    Limmie Patricia, MD  Attending Physician, Interventional Radiology    08/21/2021, 09:33

## 2021-08-21 NOTE — H&P (Signed)
VASCULAR & INTERVENTIONAL RADIOLOGY    PRE-PROCEDURE HISTORY & PHYSICAL  Name: Jason Huffman  DOB: Oct 19, 1975  Date of Service: 08/21/21  Chief Complaint:  Hodgkin's lymphoma    History of Present Illness  46 y.o. male with chief complaint and diagnosis of Hodgkin's lymphoma who needs long-term central venous access for chemotherapy administration.  Today, the patient has no complaints.   Patient is in need of the following procedure port placement.  Chronic Medical Illnesses  Past Medical History:   Diagnosis Date   . Hodgkin lymphoma (CMS HCC)              MEDICATIONS  Medications Prior to Admission     Prescriptions    allopurinoL (ZYLOPRIM) 300 mg Oral Tablet    Take 1 Tablet (300 mg total) by mouth Once a day for 4 days    busPIRone (BUSPAR) 5 mg Oral Tablet    Take 1 Tablet (5 mg total) by mouth Twice daily    cyclobenzaprine (FLEXERIL) 10 mg Oral Tablet    Take 1 Tablet (10 mg total) by mouth Three times a day    dexAMETHasone (DECADRON) 4 mg Oral Tablet    Take 1 Tablet (4 mg total) by mouth Once a day    Patient not taking:  Reported on 08/18/2021    gabapentin (NEURONTIN) 300 mg Oral Capsule    Take 300 mg by mouth    Patient not taking:  Reported on 08/04/2021    hydrOXYzine HCL (ATARAX) 25 mg Oral Tablet    Take 1 Tablet (25 mg total) by mouth Three times a day    Ibuprofen (MOTRIN) 600 mg Oral Tablet    Take 1 Tablet (600 mg total) by mouth Four times a day as needed for Pain    lidocaine-prilocaine (EMLA) 2.5-2.5 % Cream    Apply a thick layer directly over port then cover with a protective covering at least 60 min prior to port access.    omeprazole (PRILOSEC) 20 mg Oral Capsule, Delayed Release(E.C.)    Take 1 Capsule (20 mg total) by mouth Once a day    ondansetron (ZOFRAN) 8 mg Oral Tablet    Take 1 Tablet (8 mg total) by mouth Every 8 hours as needed for Nausea/Vomiting Indications: prevent nausea and vomiting from cancer chemotherapy    prochlorperazine (COMPAZINE) 10 mg Oral Tablet    Take 1  Tablet (10 mg total) by mouth Every 6 hours as needed for Nausea/Vomiting Indications: prevent nausea and vomiting from cancer chemotherapy    sennosides-docusate sodium (SENNA WITH DOCUSATE SODIUM) 8.6-50 mg Oral Tablet    Take 1 Tablet by mouth Every evening Indications: constipation    sumatriptan succinate (IMITREX) 100 mg Oral Tablet    Take 1 Tablet (100 mg total) by mouth Once, as needed for Migraine May repeat in 2 hours in needed    zolpidem (AMBIEN) 10 mg Oral Tablet    Take 1 Tablet (10 mg total) by mouth Every night        MEDICATION ALLERGIES  No Known Allergies    REVIEW OF SYSTEMS:    Negative for fevers chills chest pain shortness of breath nausea vomiting abdominal pain skin rash . All other systems negative.    Physical Examination  General:  No acute distress  Cardiovascular: Regular rate and rhythm.   Respiratory:  Nonlabored respiratory effort.  Skin:  No rash anterior chest  Neurological:     ASSESSMENT & PLAN  Jason Huffman is a 46  year old male with Hodgkin's lymphoma to have port placement performed.    SEDATION:  ASA Classification (I-V): II  Mallampati Airway Classifications (I-IV): I        Markham Jordan, PA-C 08/21/2021, 08:53  [place]  Operated by [employer] [place]  [place] [address]  Dept: [phone]  Dept Fax: [phone]

## 2021-08-21 NOTE — Nurses Notes (Signed)
Patient able to eat and drink without difficulties.   Patient able to use the restroom, and has returned to baseline ambulatory status.   Gait steady without difficulties.   Dressing remains stable.   No reports of pain noted.   IV discontinued x1.   Patient and family verbalizes understanding of discharge instructions.   Discharge instructions sent home with patient.   Escorted to lobby via wheelchair.

## 2021-08-21 NOTE — Telephone Encounter (Signed)
-----   Message from Marella Chimes sent at 08/21/2021  8:50 AM EDT -----  Harrington Challenger Pt:  Remo Lipps with [facility] is calling stating he is the pt's case manager and wants to let us know he is here to help with the pt's needs if needed. Please call him if needed or fax him at [phone].  He is also asking for a treatment plan for the pt faxed to him so they know what the plan is. Please fax.  Thanks

## 2021-08-21 NOTE — Sedation Documentation (Signed)
08/21/21  Procedure(s):  IR INFUSAPORT PLACEMENT    Diagnosis:     Sedation Informed Consent, pre-sedation risk assessment and evaluation completed.  History of previous adverse experiences with sedation/analgesia/anesthesia assessed.  Monitored conscious sedation was administered under my direct supervision by an appropriately trained sedation nurse.  Appropriate Facility and Equipment compliant.      Procedure time out  Timeouts     Coltogirone, Ysidro Evert, RN at Thu Aug 21, 2021 0843 EDT     Timeout Details     Timeout type: Preprocedure          Procedures     Panel 1: IR INFUSAPORT PLACEMENT with Beverlee Wilmarth, Chipper Oman, MD          Timeout Questions    Correct patient? Yes  Correct site? Yes  Correct side? Yes  Correct position? Yes  Correct procedure? Yes  Site marked? Yes  H&P note completed? Yes  Consents verified? Yes  Radiology studies available? Yes  Relevant lab results available? Yes  Allergies reviewed? Yes  Are all required blood products & devices for the procedure available? Yes  Is documentation verified? Yes           Staff Present     Physicians  Kazuki Ingle, MD Staff  Coltogirone, Ysidro Evert, RN  Sandi Carne, TECHNOLOGIST  Tiney Rouge, RT          Signing History     Staff Performed Signed    Donneta Romberg, RN Thu Aug 21, 2021 0843 EDT Thu Aug 21, 2021 0843 EDT                      Physician in  and out times  Physicians     Name Panel Role Time Period    Les Longmore, Chipper Oman, MD Panel 1 Primary 08/21/2021 0843      Sedation Staff     Name Type Time Period    (Not assigned) Invasive Nurse           Sedation and Procedure Times:  Sedation Start Time:: 8921  Sedation End/Recovery Start Time: 0924     Proc Name Event Type Event Time    IR INFUSAPORT PLACEMENT  Incision Start Thu Aug 21, 2021  8:44 AM    IR INFUSAPORT PLACEMENT  Incision Close Thu Aug 21, 2021  9:24 AM          Aldrete Scores    Pre Sedation  Activity: 2-->able to move 4 extremities voluntarily or on  command  Respiration: 2-->able to breathe and cough freely  Circulation: 2-->BP within 20% of pre-anesthetic level  Consciousness: 2-->fully awake  O2 Saturation: 2-->able to maintain O2 saturation greater than 92% on room air  Dressing: 2-->dry and clean or not applicable  Pain: 2-->pain free  Ambulation: 2-->able to stand up and walk straight, on ordered bedrest, or performing at previous level of functioning  Fasting/Feeding: 2-->able to drink fluids or NPO, minimal nausea/ no vomiting  Urine Output: 2-->has voided, adequate urine output per device, or not applicable  Modified Aldrete Score: 20      Post Sedation  Assessment Scored:: Post-Procedure  Activity: 2-->able to move 4 extremities voluntarily or on command  Respiration: 2-->able to breathe and cough freely  Circulation: 2-->BP within 20% of pre-anesthetic level  Consciousness: 2-->fully awake  O2 Saturation: 2-->able to maintain O2 saturation greater than 92% on room air  Dressing: 2-->dry and clean or not applicable  Pain: 2-->pain free  Ambulation: 2-->able to stand up  and walk straight, on ordered bedrest, or performing at previous level of functioning  Urine Output: 2-->has voided, adequate urine output per device, or not applicable  Post Modified Aldrete Score: 20      Sedation Type: Moderate Sedation     Medications (moderate): Fentanyl, Versed  Hospital[facility]  Unit: Interventional Radiology  IV Type: Peripheral IV  Additional Intervention needed:: No     Effects of Administration: Successful sedation w/o adverse events       Patient was continuously monitored throughout the procedure.  Provider was in attendance throughout sedation.  See Invasive Procedure Log for additional details.    Limmie Patricia, MD  Attending Physician, Interventional Radiology    08/21/2021, 09:31

## 2021-08-21 NOTE — Discharge Instructions (Signed)
Discharge Instructions: Port Placement  You had a procedure called a port placement.  You may experience pain and/or tenderness. Some swelling/bruising may be present over the access sites. This is normal.    Diet  . You may resume your normal diet.  Activity  For the next 24 hours:  . You may feel sleepy. Do not drive or operate machinery or power tools. You must have someone take you home and watch over you for the next day or so.  . Do not drink any alcoholic beverages.  . Do not make any important decisions or sign any legal documents.  . Avoid strenuous activity and heavy lifting (no more than a gallon of milk for the first three days post-procedure).   . Avoid exercise and repetitive pushing/pulling motions on the affected side.  Post-procedure Care  . Keep the dressing in place for 24 - 48 hours. If skin glue was used to close your incision, remove the dressing and shower after 24 hours. If skin glue was not used to close your incision, remove the dressing and shower after 48 hours.   . Gently cleanse the site with soap and water. Do not scrub. Pat the area dry.  . Do not submerge the site/incision or soak in the bathtub, Jacuzzi, pool, lake, etc. for 5-7 days post-procedure.  . Do not apply any lotions, creams, or ointments to the site or surrounding areas until the incision is completely healed.  . You may leave the site open to air, or dress it for comfort as needed.  . If skin glue was used to close the incision site, do not pick or pull it off. Let it come off on its own.  . If you were previously taking prescribed medications but stopped taking them in anticipation of your procedure, please reach out to your primary care team to determine when you should resume taking them.  . If your treatment team has difficulty accessing your port, have them contact IR at the numbers listed below.   Call your doctor if:  . You develop worsening pain that does not ease in intensity.  . Your temperature is greater than  101?.  . You develop redness around or drainage from the site.   . You develop swelling or a lump around the access site that increases in size over time.    If bleeding occurs, apply and hold firm pressure on the site. If bleeding persists, call 911 immediately.    Interventional Radiology   Monday - Friday 8am - 430pm: Call [phone]. Select option 3.  Evenings and weekends: Call [phone] and ask the concierge to page the IR on-call radiologist.  In the event of a life-threatening emergency, please call 911.

## 2021-08-21 NOTE — Nurses Notes (Signed)
Patient escorted back to [facility]. Offered restroom. Changed into gown. Instructed on use of call bell and television remote. Vitals signs obtained. Attempting IV access and lab collection at this time. Awaiting healthcare provider to consent patient. Will prep accordingly.Will continue to monitor.

## 2021-08-21 NOTE — Nursing Note (Signed)
Treatment plan faxed to [facility] per his request.  Adora Fridge, RN  08/21/2021, 13:32

## 2021-08-21 NOTE — Nurses Notes (Signed)
Patient returned to [facility] from procedure.   Denies any concerns.   Patient placed on monitor.   Bedside report received from Jeremy, RN.   Dressing c/d/i.   Patient educated on s/s of bleeding and bedrest precautions, verbalized understanding.   Will continue to monitor.

## 2021-08-24 ENCOUNTER — Other Ambulatory Visit (HOSPITAL_BASED_OUTPATIENT_CLINIC_OR_DEPARTMENT_OTHER): Payer: Self-pay | Admitting: Internal Medicine

## 2021-08-24 MED ORDER — OXYCODONE 5 MG TABLET
5.0000 mg | ORAL_TABLET | Freq: Four times a day (QID) | ORAL | 0 refills | Status: AC | PRN
Start: 2021-08-24 — End: 2021-08-27

## 2021-08-25 ENCOUNTER — Encounter (HOSPITAL_BASED_OUTPATIENT_CLINIC_OR_DEPARTMENT_OTHER): Payer: Self-pay | Admitting: Internal Medicine

## 2021-08-25 ENCOUNTER — Ambulatory Visit (HOSPITAL_BASED_OUTPATIENT_CLINIC_OR_DEPARTMENT_OTHER): Payer: Self-pay | Admitting: Internal Medicine

## 2021-08-25 ENCOUNTER — Other Ambulatory Visit (HOSPITAL_BASED_OUTPATIENT_CLINIC_OR_DEPARTMENT_OTHER): Payer: Self-pay | Admitting: Internal Medicine

## 2021-08-25 MED ORDER — DEXAMETHASONE 4 MG TABLET
4.0000 mg | ORAL_TABLET | Freq: Every day | ORAL | 0 refills | Status: AC
Start: 2021-08-25 — End: 2021-09-01

## 2021-08-25 NOTE — Nursing Note (Signed)
1500- Messaged wife via My Chart and called in Dex script per Dr. Harrington Challenger (see separate documentation note).  Adora Fridge, RN  08/25/2021, 15:50

## 2021-08-25 NOTE — Nursing Note (Signed)
1500- Patient wrote into My Chart c/o worsening chest pain at site of tumor. Called in to in-patient over weekend and received Oxycodone Rx from on-call fellow.  Patient states it really didn't help.  Per Dr. Harrington Challenger, called in 7 days of Dex ('4mg'$ ) to [facility] in [place].  Patient asked to message in a few days of how he is feeling.  Patient to see Dr. Harrington Challenger at his Day 15 chemo visit on 3/28.  Adora Fridge, RN  08/25/2021, 15:31

## 2021-08-25 NOTE — Telephone Encounter (Addendum)
-----   Message from Ian Bushman, [place] sent at 08/25/2021 12:44 PM EDT -----  Harrington Challenger pt:  Pt's wife says that pt is having a lot of pain and they are concerned. She is asking for a call back to discuss.  Thank you.    Spoke with pt's wife.  Since Saturday pt has had increased chest pain.  He has not been able to sleep since Saturday because of chest pain.  It is worse when he lies down.  He called yesterday and spoke with Dr. Cam Hai.  Dr. Cam Hai called ina script for Oxycodone '5mg'$  .  Pt 's wife reports that it is not helping at all.  Pt's wife said that pt was on a steroid before and this helped with pain.  Notified Dr.Ross.  Treatment team called pt.  Heath Lark, RN

## 2021-08-26 ENCOUNTER — Telehealth (HOSPITAL_BASED_OUTPATIENT_CLINIC_OR_DEPARTMENT_OTHER): Payer: Self-pay | Admitting: Internal Medicine

## 2021-08-26 NOTE — Telephone Encounter (Addendum)
Reached out to patient via telephone in response to MyChart message. Patient received ABVD on 3/13 via ultrasound guided PIV on innerl aspect of left forearm. Patient states that he has been experiencing numbness and tingling sensation for several days and noticed yesterday that his watch felt tight. He observed swelling in the left forearm. He is experiencing a dull ache / burning sensation with numbness / tingling on the palm of his hand along the ulnar side and along the pinky finger. Patient denies any red streaks and states that it is not hot to the touch. No red areas at the PIV site. Patient denies any other changes in condition. Denies fever or chills. He states that the aching/burning sensation seems to be moving upward into his bicep area this morning. Patient attached a photo of only the left arm with no discernable redness. Patient was asked to send a photo of both arms side by side for comparison to assess the degree of swelling present.     Patient advised to continue to monitor for any worsening symptoms. Reach out if any areas of redness or warmth appear, decrease in sensation or worsening in pain, fever or chills.     Treatment team at [facility] notified.   Renaee Munda RN, BSN  [facility] Triage        ----- Message from Carmelina Dane on behalf of Emile Kyllo sent at 08/25/2021  9:03 PM EDT -----  Regarding: Steroid  Contact: [phone]  This message is being sent by Carmelina Dane on behalf of Jabree Rebert.    Sorry to be a bother again. Earlier today, Scott's forearm was numb. Well, this evening,  it's swollen and very tender to the touch. It's the same arm he had the IV in for chemo. It's a dull ache, almost like a pinched nerve feeling. I attached a picture but it's hard to kind of see the swelling. We wanted to make sure this wasn't a concern for a blood clot or anything.

## 2021-08-26 NOTE — Telephone Encounter (Addendum)
Additional photos from patient for bilateral comparison.     Forwarded to treatment team at [place], BSN  [facility] Triage      ----- Message from Jason Huffman on behalf of Jason Huffman sent at 08/26/2021  8:23 AM EDT -----  Regarding: Steroid  Contact: [phone]  This message is being sent by Jason Huffman on behalf of Jason Huffman.    It's so hard to see. But you can feel a difference in his arms to touch them.

## 2021-08-27 ENCOUNTER — Emergency Department (EMERGENCY_DEPARTMENT_HOSPITAL): Payer: 59

## 2021-08-27 ENCOUNTER — Other Ambulatory Visit: Payer: Self-pay

## 2021-08-27 ENCOUNTER — Telehealth (HOSPITAL_BASED_OUTPATIENT_CLINIC_OR_DEPARTMENT_OTHER): Payer: Self-pay | Admitting: Internal Medicine

## 2021-08-27 ENCOUNTER — Emergency Department
Admission: EM | Admit: 2021-08-27 | Discharge: 2021-08-27 | Disposition: A | Discharge status: Home / Self Care | Payer: 59 | Attending: Family | Admitting: Family

## 2021-08-27 ENCOUNTER — Encounter (HOSPITAL_COMMUNITY): Payer: Self-pay

## 2021-08-27 DIAGNOSIS — R0602 Shortness of breath: Secondary | ICD-10-CM

## 2021-08-27 DIAGNOSIS — R52 Pain, unspecified: Secondary | ICD-10-CM

## 2021-08-27 DIAGNOSIS — I809 Phlebitis and thrombophlebitis of unspecified site: Secondary | ICD-10-CM

## 2021-08-27 DIAGNOSIS — I808 Phlebitis and thrombophlebitis of other sites: Secondary | ICD-10-CM | POA: Insufficient documentation

## 2021-08-27 DIAGNOSIS — C8190 Hodgkin lymphoma, unspecified, unspecified site: Secondary | ICD-10-CM | POA: Insufficient documentation

## 2021-08-27 DIAGNOSIS — M79622 Pain in left upper arm: Secondary | ICD-10-CM

## 2021-08-27 DIAGNOSIS — Z95828 Presence of other vascular implants and grafts: Secondary | ICD-10-CM | POA: Insufficient documentation

## 2021-08-27 DIAGNOSIS — Z79899 Other long term (current) drug therapy: Secondary | ICD-10-CM | POA: Insufficient documentation

## 2021-08-27 DIAGNOSIS — R600 Localized edema: Secondary | ICD-10-CM

## 2021-08-27 DIAGNOSIS — R590 Localized enlarged lymph nodes: Secondary | ICD-10-CM | POA: Insufficient documentation

## 2021-08-27 DIAGNOSIS — M79602 Pain in left arm: Secondary | ICD-10-CM | POA: Insufficient documentation

## 2021-08-27 LAB — MANUAL DIFFERENTIAL
LYMPHOCYTE %: 11 %
LYMPHOCYTE ABSOLUTE: 0.87 10*3/uL — ABNORMAL LOW (ref 1.10–5.00)
LYMPHOCYTES MANUAL: 11
MONOCYTE %: 1 %
MONOCYTE ABSOLUTE: 0.08 10*3/uL
MONOCYTES MANUAL: 1
NEUTROPHIL %: 88 %
NEUTROPHIL ABSOLUTE: 6.95 10*3/uL (ref 1.80–8.40)
NEUTROPHILS MANUAL: 88
TOTAL CELLS COUNTED [#] IN BLOOD: 100
WBC: 7.9 10*3/uL

## 2021-08-27 LAB — ECG 12 LEAD
Atrial Rate: 100 {beats}/min
Calculated P Axis: 54 degrees
Calculated R Axis: 76 degrees
Calculated T Axis: 53 degrees
PR Interval: 130 ms
QRS Duration: 86 ms
QT Interval: 340 ms
QTC Calculation: 438 ms
Ventricular rate: 100 {beats}/min

## 2021-08-27 LAB — COMPREHENSIVE METABOLIC PANEL, NON-FASTING
ALBUMIN/GLOBULIN RATIO: 1.4 (ref 0.8–1.4)
ALBUMIN: 4.4 g/dL (ref 3.5–5.7)
ALKALINE PHOSPHATASE: 51 U/L (ref 34–104)
ALT (SGPT): 32 U/L (ref 7–52)
ANION GAP: 12 mmol/L (ref 10–20)
AST (SGOT): 18 U/L (ref 13–39)
BILIRUBIN TOTAL: 0.4 mg/dL (ref 0.3–1.2)
BUN/CREA RATIO: 17 (ref 6–22)
BUN: 18 mg/dL (ref 7–25)
CALCIUM, CORRECTED: 9.2 mg/dL (ref 8.9–10.8)
CALCIUM: 9.6 mg/dL (ref 8.6–10.3)
CHLORIDE: 99 mmol/L (ref 98–107)
CO2 TOTAL: 20 mmol/L — ABNORMAL LOW (ref 21–31)
CREATININE: 1.03 mg/dL (ref 0.60–1.30)
ESTIMATED GFR: 91 mL/min/{1.73_m2} (ref >59)
GLOBULIN: 3.1 (ref 2.9–5.4)
GLUCOSE: 293 mg/dL — ABNORMAL HIGH (ref 74–109)
OSMOLALITY, CALCULATED: 276 mOsm/kg (ref 270–290)
POTASSIUM: 4.4 mmol/L (ref 3.5–5.1)
PROTEIN TOTAL: 7.5 g/dL (ref 6.4–8.9)
SODIUM: 131 mmol/L — ABNORMAL LOW (ref 136–145)

## 2021-08-27 LAB — CBC WITH DIFF
HCT: 39.2 % — ABNORMAL LOW (ref 42.0–51.0)
HGB: 13.3 g/dL — ABNORMAL LOW (ref 13.5–18.0)
MCH: 27.4 pg (ref 27.0–32.0)
MCHC: 34 g/dL (ref 32.0–36.0)
MCV: 80.5 fL (ref 78.0–99.0)
MPV: 7.4 fL (ref 7.4–10.4)
PLATELETS: 284 10*3/uL (ref 140–440)
RBC: 4.87 10*6/uL (ref 4.20–6.00)
RDW: 14.3 % (ref 11.6–14.8)
WBC: 7.9 10*3/uL (ref 4.0–10.5)
WBCS UNCORRECTED: 7.9 10*3/uL

## 2021-08-27 LAB — PT/INR
INR: 0.95 (ref <=5.00)
PROTHROMBIN TIME: 11 seconds (ref 9.8–12.7)

## 2021-08-27 LAB — PTT (PARTIAL THROMBOPLASTIN TIME): APTT: 26.5 seconds (ref 26.0–36.0)

## 2021-08-27 LAB — TROPONIN-I: TROPONIN I: 5 ng/L (ref <20)

## 2021-08-27 MED ORDER — KETOROLAC 60 MG/2 ML INTRAMUSCULAR SOLUTION
INTRAMUSCULAR | Status: AC
Start: 2021-08-27 — End: 2021-08-27
  Filled 2021-08-27: qty 2

## 2021-08-27 MED ORDER — HYDROCODONE 5 MG-ACETAMINOPHEN 325 MG TABLET
2.0000 | ORAL_TABLET | ORAL | Status: AC
Start: 2021-08-27 — End: 2021-08-27
  Administered 2021-08-27: 2 via ORAL
  Administered 2021-08-27: 0 via ORAL

## 2021-08-27 MED ORDER — HEPARIN LOCK FLUSH (PORCINE) 100 UNIT/ML INTRAVENOUS SOLUTION
1.0000 mL | Freq: Once | INTRAVENOUS | Status: DC
Start: 2021-08-27 — End: 2021-08-27
  Administered 2021-08-27: 0 mL

## 2021-08-27 MED ORDER — HYDROCODONE 5 MG-ACETAMINOPHEN 325 MG TABLET
ORAL_TABLET | ORAL | Status: AC
Start: 2021-08-27 — End: 2021-08-27
  Filled 2021-08-27: qty 1

## 2021-08-27 MED ORDER — IOHEXOL 350 MG IODINE/ML INTRAVENOUS SOLUTION
100.0000 mL | INTRAVENOUS | Status: AC
Start: 2021-08-27 — End: 2021-08-27
  Administered 2021-08-27: 100 mL via INTRAVENOUS

## 2021-08-27 MED ORDER — KETOROLAC 60 MG/2 ML INTRAMUSCULAR SOLUTION
60.0000 mg | INTRAMUSCULAR | Status: AC
Start: 2021-08-27 — End: 2021-08-27
  Administered 2021-08-27: 60 mg via INTRAMUSCULAR

## 2021-08-27 NOTE — Telephone Encounter (Addendum)
1315 Reached out to patient via phone in response to a MyChart message. He reports increased pain.  He states that yesterday pain was 5/10, today it is 7/10 . States that it is a dull ache in the forearm, elbow, shoulder and now into the left side of his neck up to the base of his skull. Denies any redness on the arm and feels that the swelling from yesterday is decreased and sensation is intact.  There is an area approximately 3" long where the PIV was placed that is still somewhat swollen per patient. He used warm and cold compresses intermittently overnight and has been awake since 0115 this morning, unable to rest due to the discomfort. He is on day 2 of Decadron for chest pain related to disease process which he states has not improved. Denies any shortness of breath or chest tightness. Patient verbalizes concerns about the possibility of a blood clot and is worried about complications interfering with treatment moving forward.- [place], APP as well as Dr Harrington Challenger regarding symptoms and team is in agreement that patient should go to local ER in order to be evaluated further due to increase in symptoms.   Patient is agreeable to this plan and states he will go to the local ER accompanied by his wife.     Treatment team at [facility] notified.   Renaee Munda RN, BSN  [facility] Triage          ---- Message from Carmelina Dane on behalf of Nile Prisk sent at 08/27/2021 12:48 PM EDT -----  Regarding: Steroid  Contact: [phone]  This message is being sent by Carmelina Dane on behalf of Jaquae Rieves.    Hello! Scott's arm is still hurting him and it's hurting up into his neck. He's been putting a cold compress on it and then alternating with a heating pad. Is there a test we could have ordered and have done here at [facility]?

## 2021-08-27 NOTE — ED Provider Notes (Signed)
[facility]  ED Primary Provider Note  History of Present Illness   Chief Complaint   Patient presents with   . Edema   . Shortness of Breath     Arrival: The patient arrived by Car    Jason Huffman is a 46 y.o. male who had concerns including Edema and Shortness of Breath.  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 He reports left arm swelling and pain since having surgery on 3/16. He also reports left sided chest pain, increased SOB, and oxygen at home has been as low as 93. He has been on a steroid course for two days.  patient states that he had the port placed on the right chest wall and had an IV in his left arm during that time.  He notes that his chest pain is little different than I have cm with a mass.  He does a little more shortness of breath and states his oxygen saturations have stayed around 93%.  Patient states that his mass with found when cardiology completed a CT on his heart.  He states the CT was negative and he had no blockages however they did find a mass.  Patient states that nothing is able to make the signs and symptoms better or worse.  He notes he felt swelling and discomfort to the left arm after having the IV placed in the left arm.  He is fearful he may have a blood clot in the supposed to go back up to receive chemo and did not want to delay that.  Patient states his last chemo treatment was 1 week ago and he is supposed to have chemo on Tuesday.  He denies fever, chills, nausea, vomiting.  He has no other complaints at this time. Pain 6/10    Review of Systems      All other systems reviewed and are negative except as noted.    Historical Data   History Reviewed This Encounter: Reviews    Physical Exam   ED Triage Vitals [08/27/21 1440]   BP (Non-Invasive) (!) 184/107   Heart Rate (!) 120   Respiratory Rate 19   Temperature 36.5 C (97.7 F)   SpO2 100 %   Weight 90.7 kg (200 lb)   Height 1.753 m ('5\' 9"' )        Constitutional:  46 y.o. male who appears in no distress. Normal color, no cyanosis. Pt sitting on stretcher, no distress noted.   HENT:   Head: Normocephalic and atraumatic.   Mouth/Throat: Oropharynx is clear and moist.   Eyes: EOMI, PERRL   Neck: Trachea midline. Neck supple.  Cardiovascular: RRR, S1, S2. No murmurs, rubs or gallops. Intact distal pulses. No pitting edema  Pulmonary/Chest: Resp even and non-labored. Lungs clear bilateral. No wheezes, rales or chest tenderness. No chest wall tenderness.   Abdominal: Bowel sounds present and normal. Abdomen soft, no tenderness, no rebound and no guarding.  Back: No midline spinal tenderness, no paraspinal tenderness, no CVA tenderness.           Musculoskeletal: No edema, tenderness or deformity. Pulses +2 radial pulses bilateral. No edema noted.   Skin: warm and dry. No rash, erythema, pallor or cyanosis. No redness noted to left arm, no streaking.   Psychiatric: normal mood and affect. Behavior is normal.   Neurological: Patient keenly alert and responsive, easily able to raise eyebrows, facial muscles/expressions symmetric, speaking in fluent sentences, moving all extremities equally and  fully, normal gait  Patient Data   Labs Ordered/Reviewed   COMPREHENSIVE METABOLIC PANEL, NON-FASTING - Abnormal; Notable for the following components:       Result Value    SODIUM 131 (*)     CO2 TOTAL 20 (*)     GLUCOSE 293 (*)     All other components within normal limits    Narrative:     Estimated Glomerular Filtration Rate (eGFR) is calculated using the CKD-EPI (2021) equation, intended for patients 80 years of age and older. If gender is not documented or "unknown", there will be no eGFR calculation.   CBC WITH DIFF - Abnormal; Notable for the following components:    HGB 13.3 (*)     HCT 39.2 (*)     All other components within normal limits   MANUAL DIFFERENTIAL - Abnormal; Notable for the following components:    LYMPHOCYTE ABSOLUTE 0.87 (*)     All other  components within normal limits   TROPONIN-I - Normal   PT/INR - Normal    Narrative:     INR OF 2.0-3.0  RECOMMENDED FOR: PROPHYLAXIS/TREATMENT OF VENEOUS THROMBOSIS, PULMONARY EMBOLISM, PREVENTION OF SYSTEMIC EMBOLISM FROM ATRIAL FIBRILATION, MYOCARDIAL INFARCTION.    INR OF 2.5-3.5  RECOMMENDED FOR MECHANICAL PROSTHETIC HEART VALVES, RECURRENT SYSTEMIC EMBOLISM, RECURRENT MYOCARDIAL INFARCTION.     PTT (PARTIAL THROMBOPLASTIN TIME) - Normal   CBC/DIFF    Narrative:     The following orders were created for panel order CBC/DIFF.  Procedure                               Abnormality         Status                     ---------                               -----------         ------                     CBC WITH GDJM[[id number]]                Abnormal            Final result               MANUAL DIFFERENTIAL[[id number]]          Abnormal            Final result                 Please view results for these tests on the individual orders.     No orders to display     Medical Decision Making        Medical Decision Making  Pt care to be transferred to [facility] for completion of care. Pt is pending CTA results for dispo. No significant findings on labs. Vital signs did improve upon arrival to room. HR improved to 106. BP 138/96.     Amount and/or Complexity of Data Reviewed  Labs: ordered. Decision-making details documented in ED Course.  Radiology: ordered.      Critical Care  Total time providing critical care: 0 minutes           Medications Administered in the ED   iohexol (OMNIPAQUE 350) infusion (100 mL  Intravenous Given 08/27/21 1706)     Clinical Impression   Pain       Disposition: Data Unavailable

## 2021-08-27 NOTE — ED APP Handoff Note (Signed)
[facility]  Emergency Department  Course Note    Care/report received from Dr. Rayne Du att. providers found at  18:58.  Per report:  Jason Huffman is a 46 y.o. male who had concerns including Edema and Shortness of Breath.    Pending labs/imaging/consults:  Chest angio  Plan:  Upper extremity ultrasound demonstrated superficial thrombophlebitis CT angiogram does not demonstrate any pulmonary embolism.  Patient is encouraged on symptomatic management and follow up with primary care we discussed return precautions  Course:      Following the history, physical exam, and ED workup, the patient was deemed stable and suitable for discharge. The patient/caregiver was advised to return to the ED for any new or worsening symptoms. Discharge medications, and follow-up instructions were discussed with the patient/caregiver in detail, who verbalizes understanding. The patient/caregiver is in agreement and is comfortable with the plan of care.    Disposition: Discharged         Current Discharge Medication List      CONTINUE these medications - NO CHANGES were made during your visit.      Details   allopurinoL 300 mg Tablet  Commonly known as: ZYLOPRIM   300 mg, Oral, DAILY  Qty: 5 Tablet  Refills: 0     busPIRone 5 mg Tablet  Commonly known as: BUSPAR   5 mg, Oral, 2 TIMES DAILY  Refills: 0     cyclobenzaprine 10 mg Tablet  Commonly known as: FLEXERIL   10 mg, Oral, 3 TIMES DAILY  Refills: 0     * dexAMETHasone 4 mg Tablet  Commonly known as: DECADRON   4 mg, Oral, DAILY  Qty: 5 Tablet  Refills: 0     * dexAMETHasone 4 mg Tablet  Commonly known as: DECADRON   4 mg, Oral, DAILY  Qty: 7 Tablet  Refills: 0     gabapentin 300 mg Capsule  Commonly known as: NEURONTIN   300 mg  Refills: 0     hydrOXYzine HCL 25 mg Tablet  Commonly known as: ATARAX   25 mg, Oral, 3 TIMES DAILY  Refills: 0     Ibuprofen 600 mg Tablet  Commonly known as: MOTRIN   600 mg, Oral, 4 TIMES DAILY PRN  Refills: 0      lidocaine-prilocaine 2.5-2.5 % Cream  Commonly known as: EMLA   Apply a thick layer directly over port then cover with a protective covering at least 60 min prior to port access.  Qty: 30 g  Refills: 2     omeprazole 20 mg Capsule, Delayed Release(E.C.)  Commonly known as: PRILOSEC   20 mg, Oral, DAILY  Refills: 0     ondansetron 8 mg Tablet  Commonly known as: ZOFRAN   8 mg, Oral, EVERY 8 HOURS PRN  Qty: 60 Tablet  Refills: 5     oxyCODONE 5 mg Tablet  Commonly known as: ROXICODONE   5 mg, Oral, EVERY 6 HOURS PRN  Qty: 12 Tablet  Refills: 0     prochlorperazine 10 mg Tablet  Commonly known as: COMPAZINE   10 mg, Oral, EVERY 6 HOURS PRN  Qty: 60 Tablet  Refills: 5     sennosides-docusate sodium 8.6-50 mg Tablet  Commonly known as: Senna with Docusate Sodium   1 Tablet, Oral, EVERY EVENING  Qty: 30 Tablet  Refills: 5     sumatriptan succinate 100 mg Tablet  Commonly known as: IMITREX   100 mg, Oral, ONCE PRN, May repeat in 2  hours in needed  Refills: 0     zolpidem 10 mg Tablet  Commonly known as: AMBIEN   10 mg, Oral, NIGHTLY  Refills: 0         * This list has 2 medication(s) that are the same as other medications prescribed for you. Read the directions carefully, and ask your doctor or other care provider to review them with you.              Follow up:   Adriana Mccallum, DO  [address]  [place] [address]  [phone]            Clinical Impression   Pain   Superficial thrombophlebitis, involving unspecified site (Primary)         Geraldo Docker, PA-C  08/27/2021, 18:58

## 2021-08-27 NOTE — ED Nurses Note (Signed)
Pt to er 68 with report of left sided chest pain. Pt states he had iv in left forearm 1 week ago for chemo and his left forearm has been tender to touch since then. Pt reports he has a mass in left side of chest. Pt wanting to see if he has blood clot, pt reports SOB, provider at bedside.

## 2021-08-27 NOTE — ED Triage Notes (Signed)
States swelling in left forearm for couple days with pain in arm. Intermittent shortness of breath. States getting chemo for hodgkins lymphoma.

## 2021-08-27 NOTE — ED APP Handoff Note (Cosign Needed)
[facility]  Emergency Department  Provider in Triage Note    Name: Jason Huffman  Age: 46 y.o.  Gender: male     Subjective:   Jason Huffman is a 46 y.o. male who presents with complaint of Edema and Shortness of Breath  . 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 He reports left arm swelling and pain since having surgery on 3/16. He also reports left sided chest pain, increased SOB, and oxygen at home has been as low as 93. He has been on a steroid course for two days.       Objective:   Filed Vitals:    08/27/21 1440   BP: (!) 184/107   Pulse: (!) 120   Resp: 19   Temp: 36.5 C (97.7 F)   SpO2: 100%      Focused Physical Exam shows WNWD male pt    Assessment:  A medical screening exam was completed.  This patient is a 46 y.o. male with initial findings showing SOB, recent surgery    Plan:  Please see initial orders and work-up below.  This is to be continued with full evaluation in the main Emergency Department.     No current facility-administered medications for this encounter.     Results for orders placed or performed during the hospital encounter of 08/27/21 (from the past 24 hour(s))   CBC/DIFF    Narrative    The following orders were created for panel order CBC/DIFF.  Procedure                               Abnormality         Status                     ---------                               -----------         ------                     CBC WITH DIFF[[id number]]                                                                 Please view results for these tests on the individual orders.        Remer, PA-C  08/27/2021, 14:37

## 2021-08-27 NOTE — Discharge Instructions (Addendum)
CT scan does not demonstrate any blood clot in your lungs the ultrasound demonstrates superficial blood clot.  The apply warm compresses to this.  Intake Tylenol or Motrin for discomfort.  Follow up with her primary care to make sure getting better.  If you have new or worsening symptoms return to the emergency department

## 2021-08-29 ENCOUNTER — Other Ambulatory Visit (HOSPITAL_BASED_OUTPATIENT_CLINIC_OR_DEPARTMENT_OTHER): Payer: Self-pay | Admitting: Internal Medicine

## 2021-08-29 DIAGNOSIS — C8190 Hodgkin lymphoma, unspecified, unspecified site: Secondary | ICD-10-CM

## 2021-09-01 ENCOUNTER — Ambulatory Visit (HOSPITAL_BASED_OUTPATIENT_CLINIC_OR_DEPARTMENT_OTHER): Payer: Self-pay

## 2021-09-01 ENCOUNTER — Inpatient Hospital Stay (HOSPITAL_BASED_OUTPATIENT_CLINIC_OR_DEPARTMENT_OTHER): Payer: 59

## 2021-09-02 ENCOUNTER — Inpatient Hospital Stay (HOSPITAL_BASED_OUTPATIENT_CLINIC_OR_DEPARTMENT_OTHER)
Admission: RE | Admit: 2021-09-02 | Discharge: 2021-09-02 | Disposition: A | Discharge status: Home / Self Care | Payer: 59 | Source: Ambulatory Visit

## 2021-09-02 ENCOUNTER — Inpatient Hospital Stay (HOSPITAL_BASED_OUTPATIENT_CLINIC_OR_DEPARTMENT_OTHER): Payer: 59

## 2021-09-02 ENCOUNTER — Other Ambulatory Visit: Payer: Self-pay

## 2021-09-02 ENCOUNTER — Ambulatory Visit (HOSPITAL_BASED_OUTPATIENT_CLINIC_OR_DEPARTMENT_OTHER): Payer: 59 | Admitting: Internal Medicine

## 2021-09-02 VITALS — BP 131/82 | HR 72 | Temp 97.8°F | Resp 18 | Ht 69.0 in | Wt 201.8 lb

## 2021-09-02 DIAGNOSIS — C8190 Hodgkin lymphoma, unspecified, unspecified site: Secondary | ICD-10-CM

## 2021-09-02 DIAGNOSIS — Z95828 Presence of other vascular implants and grafts: Secondary | ICD-10-CM | POA: Insufficient documentation

## 2021-09-02 DIAGNOSIS — L299 Pruritus, unspecified: Secondary | ICD-10-CM | POA: Insufficient documentation

## 2021-09-02 DIAGNOSIS — Z5111 Encounter for antineoplastic chemotherapy: Secondary | ICD-10-CM | POA: Insufficient documentation

## 2021-09-02 DIAGNOSIS — Z87891 Personal history of nicotine dependence: Secondary | ICD-10-CM | POA: Insufficient documentation

## 2021-09-02 DIAGNOSIS — R0789 Other chest pain: Secondary | ICD-10-CM | POA: Insufficient documentation

## 2021-09-02 DIAGNOSIS — R079 Chest pain, unspecified: Secondary | ICD-10-CM

## 2021-09-02 LAB — COMPREHENSIVE METABOLIC PANEL, NON-FASTING
ALBUMIN: 3.5 g/dL (ref 3.5–5.0)
ALKALINE PHOSPHATASE: 45 U/L (ref 45–115)
ALT (SGPT): 22 U/L (ref 10–55)
ANION GAP: 7 mmol/L (ref 4–13)
AST (SGOT): 15 U/L (ref 8–45)
BILIRUBIN TOTAL: 0.4 mg/dL (ref 0.3–1.3)
BUN/CREA RATIO: 26 — ABNORMAL HIGH (ref 6–22)
BUN: 23 mg/dL (ref 8–25)
CALCIUM: 8.9 mg/dL (ref 8.5–10.0)
CHLORIDE: 103 mmol/L (ref 96–111)
CO2 TOTAL: 25 mmol/L (ref 22–30)
CREATININE: 0.88 mg/dL (ref 0.75–1.35)
ESTIMATED GFR: >90 mL/min/BSA (ref >=60)
GLUCOSE: 120 mg/dL (ref 65–125)
POTASSIUM: 3.7 mmol/L (ref 3.5–5.1)
PROTEIN TOTAL: 6.4 g/dL (ref 6.4–8.3)
SODIUM: 135 mmol/L — ABNORMAL LOW (ref 136–145)

## 2021-09-02 LAB — MANUAL DIFF AND MORPHOLOGY-SYSMEX
BASOPHIL #: <0.1 10*3/uL (ref <=0.20)
BASOPHIL %: 1 %
EOSINOPHIL #: <0.1 10*3/uL (ref <=0.50)
EOSINOPHIL %: 2 %
LYMPHOCYTE #: 2.38 10*3/uL (ref 1.00–4.80)
LYMPHOCYTE %: 61 %
MONOCYTE #: 0.66 10*3/uL (ref 0.20–1.10)
MONOCYTE %: 17 %
MYELOCYTE %: 2 %
NEUTROPHIL #: 0.7 10*3/uL — ABNORMAL LOW (ref 1.50–7.70)
NEUTROPHIL %: 18 %
RBC MORPHOLOGY: NORMAL

## 2021-09-02 LAB — CBC WITH DIFF
HCT: 38.4 % — ABNORMAL LOW (ref 38.9–52.0)
HGB: 13.3 g/dL — ABNORMAL LOW (ref 13.4–17.5)
MCH: 27.8 pg (ref 26.0–32.0)
MCHC: 34.6 g/dL (ref 31.0–35.5)
MCV: 80.3 fL (ref 78.0–100.0)
MPV: 8.4 fL — ABNORMAL LOW (ref 8.7–12.5)
PLATELETS: 259 10*3/uL (ref 150–400)
RBC: 4.78 10*6/uL (ref 4.50–6.10)
RDW-CV: 14.9 % (ref 11.5–15.5)
WBC: 3.9 10*3/uL (ref 3.7–11.0)

## 2021-09-02 LAB — LDH: LDH: 183 U/L (ref 125–220)

## 2021-09-02 LAB — PLATELETS AND ANC CANCER CENTER
NEUTROPHILS #: 0.64 10*3/uL — ABNORMAL LOW (ref 1.50–7.70)
PLATELET COUNT: 259 10*3/uL (ref 150–400)

## 2021-09-02 MED ORDER — SODIUM CHLORIDE 0.9 % INTRAVENOUS SOLUTION
INTRAVENOUS | Status: DC
Start: 2021-09-02 — End: 2021-09-03
  Administered 2021-09-02: 0 mL via INTRAVENOUS

## 2021-09-02 MED ORDER — HYDROCORTISONE SOD SUCCINATE 100 MG/2 ML VIAL WRAPPER
100.0000 mg | Freq: Once | INTRAMUSCULAR | Status: DC | PRN
Start: 2021-09-02 — End: 2021-09-03

## 2021-09-02 MED ORDER — DOXORUBICIN 2 MG/ML INTRAVENOUS SOLUTION
25.0000 mg/m2 | Freq: Once | INTRAVENOUS | Status: AC
Start: 2021-09-02 — End: 2021-09-02
  Administered 2021-09-02: 52.8 mg via INTRAVENOUS
  Filled 2021-09-02: qty 26.4

## 2021-09-02 MED ORDER — SODIUM CHLORIDE 0.9 % INTRAVENOUS SOLUTION
10.0000 [IU]/m2 | Freq: Once | INTRAVENOUS | Status: AC
Start: 2021-09-02 — End: 2021-09-02
  Administered 2021-09-02: 21 [IU] via INTRAVENOUS
  Administered 2021-09-02: 0 [IU] via INTRAVENOUS
  Filled 2021-09-02: qty 7

## 2021-09-02 MED ORDER — LORAZEPAM 2 MG/ML INJECTION WRAPPER
1.0000 mg | INTRAMUSCULAR | Status: DC | PRN
Start: 2021-09-02 — End: 2021-09-03

## 2021-09-02 MED ORDER — MEPERIDINE (PF) 25 MG/ML INJECTION SOLUTION
12.5000 mg | Freq: Once | INTRAMUSCULAR | Status: DC | PRN
Start: 2021-09-02 — End: 2021-09-03

## 2021-09-02 MED ORDER — ALBUTEROL SULFATE 2.5 MG/3 ML (0.083 %) SOLUTION FOR NEBULIZATION
2.5000 mg | INHALATION_SOLUTION | Freq: Once | RESPIRATORY_TRACT | Status: DC | PRN
Start: 2021-09-02 — End: 2021-09-03

## 2021-09-02 MED ORDER — DIPHENHYDRAMINE 50 MG/ML INJECTION SOLUTION
25.0000 mg | Freq: Once | INTRAMUSCULAR | Status: DC | PRN
Start: 2021-09-02 — End: 2021-09-03

## 2021-09-02 MED ORDER — DEXAMETHASONE 4 MG TABLET
20.0000 mg | ORAL_TABLET | Freq: Once | ORAL | Status: AC
Start: 2021-09-02 — End: 2021-09-02
  Administered 2021-09-02: 20 mg via ORAL
  Filled 2021-09-02: qty 5

## 2021-09-02 MED ORDER — VINBLASTINE 1 MG/ML INTRAVENOUS SOLUTION
6.0000 mg/m2 | Freq: Once | INTRAVENOUS | Status: AC
Start: 2021-09-02 — End: 2021-09-02
  Administered 2021-09-02: 0 mg via INTRAVENOUS
  Administered 2021-09-02: 12.7 mg via INTRAVENOUS
  Filled 2021-09-02: qty 12.7

## 2021-09-02 MED ORDER — EPINEPHRINE 1 MG/ML (1 ML) INJECTION SOLUTION
0.3000 mg | Freq: Once | INTRAMUSCULAR | Status: DC | PRN
Start: 2021-09-02 — End: 2021-09-03

## 2021-09-02 MED ORDER — ACETAMINOPHEN 325 MG TABLET
650.0000 mg | ORAL_TABLET | Freq: Once | ORAL | Status: AC
Start: 2021-09-02 — End: 2021-09-02
  Administered 2021-09-02: 650 mg via ORAL
  Filled 2021-09-02: qty 2

## 2021-09-02 MED ORDER — PALONOSETRON 0.25 MG/5 ML INTRAVENOUS SOLUTION
0.2500 mg | Freq: Once | INTRAVENOUS | Status: AC
Start: 2021-09-02 — End: 2021-09-02
  Administered 2021-09-02: 0.25 mg via INTRAVENOUS
  Filled 2021-09-02: qty 5

## 2021-09-02 MED ORDER — DIPHENHYDRAMINE 50 MG/ML INJECTION SOLUTION
50.0000 mg | Freq: Once | INTRAMUSCULAR | Status: DC | PRN
Start: 2021-09-02 — End: 2021-09-03

## 2021-09-02 MED ORDER — ALBUTEROL SULFATE HFA 90 MCG/ACTUATION AEROSOL INHALER - RN
2.0000 | Freq: Once | RESPIRATORY_TRACT | Status: DC | PRN
Start: 2021-09-02 — End: 2021-09-03

## 2021-09-02 MED ORDER — SODIUM CHLORIDE 0.9 % INTRAVENOUS SOLUTION
375.0000 mg/m2 | Freq: Once | INTRAVENOUS | Status: AC
Start: 2021-09-02 — End: 2021-09-02
  Administered 2021-09-02: 0 mg via INTRAVENOUS
  Administered 2021-09-02: 791.3 mg via INTRAVENOUS
  Filled 2021-09-02: qty 79.13

## 2021-09-02 MED ORDER — FAMOTIDINE (PF) 20 MG/2 ML INTRAVENOUS SOLUTION
20.0000 mg | Freq: Once | INTRAVENOUS | Status: DC | PRN
Start: 2021-09-02 — End: 2021-09-03

## 2021-09-02 MED ORDER — SODIUM CHLORIDE 0.9% FLUSH BAG - 250 ML
INTRAVENOUS | Status: DC | PRN
Start: 2021-09-02 — End: 2021-09-03

## 2021-09-02 NOTE — Nurses Notes (Addendum)
26 - Pt admitted to VAD/Triage Area for  labs and venous access.  Port accessed per protocol using sterile technique with a 20 gauge 3/4 inch non-coring needle, labs drawn, scanned and sent. Sterile dressing with clear antimicrobial patch applied. Line flushed per protocol and capped. New curos caps applied. Awaiting results. Pt left VAD/Triage Area in good condition. Pt ambulatory to Infusion Area accompanied by spouse. Collins Scotland, RN   1315 Pt to room #36.  Educated.  Blenoxane/Adriamycin/Velban/DTIC Dome.  Dr. Harrington Challenger in to see pt.  May order tx.  Orders released.  Estevan Oaks, RN  [phone] Premedicated per [facility] via port + flush and blood return.  Placed on 30 min wait.  Estevan Oaks, RN  [phone] Wait completed without incidence per [facility] time; pt tolerated well. Blenoxane begun per [facility] via port with good blood return. Chemotherapy policy and procedure followed. Chemotherapy reviewed by 2 RN's. Verified during chemotherapy double read that chemotherapy is proper color and consistency, as well as, chemotherapy has not expired. Verified during double read that treatment sequencing is correct.  Pump rate was visually confirmed with Ileene Rubens. Pt has no complaints at this time. Call bell within reach.  Myles Rosenthal, RN  [phone] Blenoxane infusion completed .    Doxorubicin push  begun per [facility] via Right port  with good blood return. Chemotherapy policy and procedure followed. Verified during chemotherapy double read that chemotherapy is proper color and consistency, as well as, chemotherapy has not expired.  Verified during double read that treatment sequencing is correct. Chemotherapy reviewed by 2 RN's. Pump rate was visually confirmed with Zannie Cove ,RN.  Pt has no complaints at this time.  Call bell within reach.  1600 Doxorubicin  Push  completed.    Velban infusion  begun per [facility] via Right port  with good blood return. Chemotherapy policy and procedure followed. Verified during chemotherapy double read that  chemotherapy is proper color and consistency, as well as, chemotherapy has not expired.  Verified during double read that treatment sequencing is correct. Chemotherapy reviewed by 2 RN's. Pump rate was visually confirmed with Audree Bane ,RN.  Pt has no complaints at this time.  Call bell within reach. Audree Bane RN   [phone] Velban completed without incidence per MAR time; pt tolerated well. Dacarbazine begun per [facility] via port with good blood return. Chemotherapy policy and procedure followed. Chemotherapy reviewed by 2 RN's. Verified during chemotherapy double read that chemotherapy is proper color and consistency, as well as, chemotherapy has not expired. Verified during double read that treatment sequencing is correct.  Pump rate was visually confirmed with Vanita Ingles. Pt has no complaints at this time. Call bell within reach.  Estevan Oaks, RN  [phone] - Dacarbazine complete per [facility] without incident.  Patient port with positive blood return flushed with 20 cc NS and de-accessed per flowsheet.  Patient ambulatory out of Infusion with steady gait accompanied by spouse. Myles Rosenthal, RN

## 2021-09-03 ENCOUNTER — Encounter (HOSPITAL_BASED_OUTPATIENT_CLINIC_OR_DEPARTMENT_OTHER): Payer: Self-pay | Admitting: Internal Medicine

## 2021-09-03 NOTE — Cancer Center Note (Signed)
Department of Hematology/Oncology  Return Patient Visit      Date: 09/02/2021  Name: Jason Huffman  MRN: [id number]  Referring Physician: Scarlette Shorts, MD  Primary Care Provider: Adriana Mccallum, DO    Reason for visit/consultation No chief complaint on file.      History of Present Illness:  Information Obtained from: patient and spouse  1. [name] is a 46 year old man who presents for evaluation of Hodgkin Lymphoma.  He noted that for the past year he hasn't been feeling right, that his energy hasn't been good.  He also started itching, and saw multiple doctors about the itching and received medications and creams for it.  In the fall he developed intermittent chest pain, which worried him because both his parents died young of heart disease. In November started his evaluation and ultimately had a CT chest, which showed a large mediastinal mass, 12 cm, with associated lymphadenopathy.  He had a biopsy done, which showed classical Hodgkin Lymphoma.  He denies fevers, chills, night sweats.  His main complaints are fatigue, itching, and intermittent chest pain, which is sharp and positional.  2. Evalution at [facility] on 08/04/2021 with WBC 11.9, HGB 14.2, PLC 308K, ANC 9.84, ALC 540, ESR 11, creatinine 0.88, LDH 238, albumin 3.8, Hep B immunized, HIV and Hep C negative, TTE on 08/15/21 with EF of 60-65% with normal diastolic function. PFT's with FEV1 of 91% and DLCOunc 94%.     Interval History:  [name] presents today with his wife. He is overall doing well. He is being seen today due to recent superficial thrombophlebitis and chest pain. He completed his second course of steroids on Sunday and notes that he has had improvement of his pain. No chest pain, palpitations, shortness of breath or cough. No abdominal pain, constipation or diarrhea. No fevers, chills or night sweats.   He was recently seen in local ED for L arm pain and swelling after PIV from port placement. He was diagnosed  with superficial thrombophlebitis in the upper L arm. He reports improvement, advised to continue warm compresses. No intervention needed.     Past Medical History  Past Medical History:   Diagnosis Date    Hodgkin lymphoma (CMS HCC)        No Known Allergies  Current Outpatient Medications   Medication Sig    allopurinoL (ZYLOPRIM) 300 mg Oral Tablet Take 1 Tablet (300 mg total) by mouth Once a day for 4 days    busPIRone (BUSPAR) 5 mg Oral Tablet Take 1 Tablet (5 mg total) by mouth Twice daily    cyclobenzaprine (FLEXERIL) 10 mg Oral Tablet Take 1 Tablet (10 mg total) by mouth Three times a day    dexAMETHasone (DECADRON) 4 mg Oral Tablet Take 1 Tablet (4 mg total) by mouth Once a day (Patient not taking: Reported on 08/18/2021)    gabapentin (NEURONTIN) 300 mg Oral Capsule Take 300 mg by mouth (Patient not taking: Reported on 08/04/2021)    hydrOXYzine HCL (ATARAX) 25 mg Oral Tablet Take 1 Tablet (25 mg total) by mouth Three times a day    Ibuprofen (MOTRIN) 600 mg Oral Tablet Take 1 Tablet (600 mg total) by mouth Four times a day as needed for Pain    lidocaine-prilocaine (EMLA) 2.5-2.5 % Cream Apply a thick layer directly over port then cover with a protective covering at least 60 min prior to port access.    omeprazole (PRILOSEC) 20 mg Oral Capsule, Delayed Release(E.C.) Take 1  Capsule (20 mg total) by mouth Once a day    ondansetron (ZOFRAN) 8 mg Oral Tablet Take 1 Tablet (8 mg total) by mouth Every 8 hours as needed for Nausea/Vomiting Indications: prevent nausea and vomiting from cancer chemotherapy    prochlorperazine (COMPAZINE) 10 mg Oral Tablet Take 1 Tablet (10 mg total) by mouth Every 6 hours as needed for Nausea/Vomiting Indications: prevent nausea and vomiting from cancer chemotherapy    sennosides-docusate sodium (SENNA WITH DOCUSATE SODIUM) 8.6-50 mg Oral Tablet Take 1 Tablet by mouth Every evening Indications: constipation    sumatriptan succinate (IMITREX) 100 mg Oral Tablet Take 1 Tablet (100 mg  total) by mouth Once, as needed for Migraine May repeat in 2 hours in needed    zolpidem (AMBIEN) 10 mg Oral Tablet Take 1 Tablet (10 mg total) by mouth Every night       Family History  Family Medical History:       Problem Relation (Age of Onset)    Coronary Artery Disease Mother, Father            Social History  Occupation:   Social History     Occupational History    Occupation: School bus driver    Hometown: [place] [address]   reports that he quit smoking about 16 years ago. His smoking use included cigarettes. He has never used smokeless tobacco. He reports that he does not drink alcohol and does not use drugs.  ROS: Other than ROS in the HPI, all other systems were negative.    Physical Examination:  Most Recent Vitals    Flowsheet Row Lab from 09/02/2021 in [facility], [place]   Temperature 36.6 C (97.8 F) filed at... 09/02/2021 1314   Heart Rate 72 filed at... 09/02/2021 1314   Respiratory Rate 18 filed at... 09/02/2021 1314   BP (Non-Invasive) 131/82 filed at... 09/02/2021 1314   SpO2 98 % filed at... 09/02/2021 1314   Height 1.753 m (_0 ) filed at... 09/02/2021 1314   Weight 91.5 kg (201 lb 12.8 oz) filed at... 09/02/2021 1314   BMI (Calculated) 29.86 filed at... 09/02/2021 1314   BSA (Calculated) 2.11 filed at... 09/02/2021 1314   ECOG Status: 0 - Fully active, able to carry on all pre-disease performance without restriction.     General: appears in good health  Eyes: Conjunctiva clear.  Neck: no adenopathy  Lungs: breathing comfortably  Cardiovascular: Regular rate and rhythm  Abdomen: non-distended  Extremities: no cyanosis or edema  Skin: warm and dry  Neurologic: grossly normal  Lymphatics: No nodes appreciated  Psychiatric: AOx3  Access:  No current access site    Labs  COMPLETE BLOOD COUNT   Lab Results   Component Value Date    WBC 3.9 09/02/2021    HGB 13.3 (L) 09/02/2021    HCT 38.4 (L) 09/02/2021    PLTCNT 259 09/02/2021    PLTCNT 259 09/02/2021        DIFFERENTIAL  Lab Results   Component Value Date    PMNS 18 09/02/2021    LYMPHOCYTES 61 09/02/2021    MYELOCYTES 2 09/02/2021    MONOCYTES 17 09/02/2021    EOSINOPHIL 2 09/02/2021    BASOPHILS <0.10 09/02/2021    BASOPHILS 1 09/02/2021    PMNABS 0.64 (L) 09/02/2021    PMNABS 0.70 (L) 09/02/2021    LYMPHSABS 1.24 08/18/2021    EOSABS <0.10 09/02/2021    MONOSABS 0.66 09/02/2021     COMPREHENSIVE  METABOLIC PANEL - NON FASTING  Lab Results   Component Value Date    SODIUM 135 (L) 09/02/2021    POTASSIUM 3.7 09/02/2021    CHLORIDE 103 09/02/2021    CO2 25 09/02/2021    ANIONGAP 7 09/02/2021    BUN 23 09/02/2021    CREATININE 0.88 09/02/2021    GLUCOSENF 120 09/02/2021    CALCIUM 8.9 09/02/2021    ALBUMIN 3.5 09/02/2021    TOTALPROTEIN 6.4 09/02/2021    ALKPHOS 45 09/02/2021    AST 15 09/02/2021    ALT 22 09/02/2021       Pathology:  Slide reviewed at [facility]:  pending      Radiology  Outside CT chest reviewed by me, large anterior mediastinal mass    Assessment/Plan:   [name] is a 46 y.o. male who presents for evaluation of Hodgkin lymphoma.    1. Hodgkin lymphoma: Underlying diagnosis again discussed with patient. Pathology still being reviewed at [facility] but based on outside pathology diagnosis is consistent with classical Hodgkin Lymphoma. Based on imaging he has Stage IIAX disease, due to the size of his mass and concern for pericardial invasion, Dr. Harrington Challenger is planning to treat with 6 cycles of PET adapted ABVD per RATHL trial follwoed by radiation to site of bulk. TTE within normal limits. PFT's also WNL. Verbal and written consent obtained today. Patient has no interest in fertility preservation.  Proceed with C1D15 of ABVD  - 09/02/2021: Patient had originally requested to see Dr. Durene Romans as [facility] is closer for patient. At the time, patient was unable to be referred and it was requested that treatment be initiated here. Dr. Harrington Challenger spoke with Dr. Durene Romans today regarding transfer. Decision made to  continue treatment here through second cycle with follow-up PET. If resolution or large improvement in disease status per PET, will transfer to [facility] for remaining cycles. If minimal improvement or no improvement, will continue here. Patient in agreement with plan. He currently has an appointment with Dr. Durene Romans planned for May 8th, 2023.      2. Chest pain: Seems like pericarditis from local mass. Dr. Harrington Challenger prescribed dexamethasone 4 mg po daily x 7 days with improvement  - Patient recently finished another course of steroids on Sunday for chest pain. Discussed the risk/benefit of frequent steroid use, and that with treatment pain should resolve as it is r/t to mass.     3. Itching: Due to underlying HL, should improve with treatment.    4. IV access:   - R chest port. No active issues.     Disposition:  RTC in two weeks for C2D1 of ABVD, MD visit.     I independently of the faculty provider spent a total of 20 minutes in direct/indirect care of this patient including initial evaluation, review of laboratory, radiology, diagnostic studies, review of medical record, order entry and coordination of care.    Patient seen with co-signing faculty, Dr. Harrington Challenger, in clinic.     [name], APRN   Section of Hematology/Oncology   [facility]      CC:  Adriana Mccallum, [place] [address]  [place] [address]    No primary care provider on file.    I personally saw and evaluated the patient as part of a shared service with an APP.    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    I independently of the APP spent a total of (23) minutes in direct/indirect care of this patient including initial evaluation, review of laboratory, radiology, diagnostic studies, review of medical record, order entry and coordination of care.     Katheren Puller, MD  09/03/2021, 16:07

## 2021-09-11 ENCOUNTER — Telehealth (HOSPITAL_BASED_OUTPATIENT_CLINIC_OR_DEPARTMENT_OTHER): Payer: Self-pay | Admitting: Internal Medicine

## 2021-09-11 NOTE — Telephone Encounter (Addendum)
1156: Consulted Dr. Harrington Huffman regarding below message and history obtained from triage. We discussed no further steroids are recommended and she advised that patient present to local ED to rule out cardiac concerns. I spoke with Jason Huffman, patient wife, and advised that patient present to local ED for cardiac work-up such as labs, EKG, troponin, etc. She stated that patient was adamant that he was not going to [facility] hospital due to previous experience. I inquired if there were any other local facilities they felt comfortable going to, and she stated "not any good ones". I again reiterated the importance of evaluation for cardiac concern. She stated she would discuss with patient and encourage him to be evaluated and would contact us either way with their plan.   Jason Hatchet, FNP-C  09/11/2021, 11:59      Received MyChart messaged from patient wife. Called and spoke with Jason Huffman. She stated that Jason Huffman is having severe pain in his chest, right around his heart. Stated that she didn't want to take him back to the ED at home, because they had a terrible experience with his blood clot 2 weeks ago. She would be willing to bring him here, but she doesn't think he would be able to make the car trip in the amount of pain he is in. Pain rated 10/10. Took Tylenol, ibuprofen, and Oxycodone '5mg'$ , and doesn't help. Patient is rolling on the floor in pain. Rn stated would contact the clinic and report back. Clinic notified and stated to contact the patient. Jason Muir, RN        ----- Message from Jason Huffman on behalf of Jason Huffman sent at 09/11/2021  8:23 AM EDT -----  Regarding: Steroid  Contact: [phone]  This message is being sent by Jason Huffman on behalf of Jason Huffman.    Good morning. Jason Huffman is extremely miserable this morning. We don't want to go to the hospital because we know there isn't much they'll do. He hasn't gotten more than a few hours of sleep in the last few days because of this. Is there  anything you can think of that would help him?

## 2021-09-15 ENCOUNTER — Encounter (HOSPITAL_COMMUNITY): Payer: Self-pay | Admitting: Internal Medicine

## 2021-09-16 ENCOUNTER — Other Ambulatory Visit (HOSPITAL_COMMUNITY): Payer: Self-pay

## 2021-09-16 ENCOUNTER — Other Ambulatory Visit (HOSPITAL_BASED_OUTPATIENT_CLINIC_OR_DEPARTMENT_OTHER): Payer: Self-pay | Admitting: Internal Medicine

## 2021-09-16 ENCOUNTER — Ambulatory Visit (HOSPITAL_BASED_OUTPATIENT_CLINIC_OR_DEPARTMENT_OTHER): Payer: 59 | Admitting: Internal Medicine

## 2021-09-16 ENCOUNTER — Other Ambulatory Visit: Payer: Self-pay

## 2021-09-16 ENCOUNTER — Inpatient Hospital Stay: Payer: 59 | Attending: Internal Medicine

## 2021-09-16 ENCOUNTER — Inpatient Hospital Stay (HOSPITAL_BASED_OUTPATIENT_CLINIC_OR_DEPARTMENT_OTHER)
Admission: RE | Admit: 2021-09-16 | Discharge: 2021-09-16 | Disposition: A | Discharge status: Home / Self Care | Payer: 59 | Source: Ambulatory Visit | Attending: Internal Medicine | Admitting: Internal Medicine

## 2021-09-16 ENCOUNTER — Encounter (HOSPITAL_BASED_OUTPATIENT_CLINIC_OR_DEPARTMENT_OTHER): Payer: Self-pay | Admitting: Internal Medicine

## 2021-09-16 VITALS — BP 140/92 | HR 97 | Temp 97.6°F | Resp 18 | Ht 69.0 in | Wt 203.5 lb

## 2021-09-16 DIAGNOSIS — C8192 Hodgkin lymphoma, unspecified, intrathoracic lymph nodes: Secondary | ICD-10-CM | POA: Insufficient documentation

## 2021-09-16 DIAGNOSIS — C8190 Hodgkin lymphoma, unspecified, unspecified site: Secondary | ICD-10-CM

## 2021-09-16 DIAGNOSIS — R0789 Other chest pain: Secondary | ICD-10-CM

## 2021-09-16 DIAGNOSIS — R079 Chest pain, unspecified: Secondary | ICD-10-CM | POA: Insufficient documentation

## 2021-09-16 DIAGNOSIS — Z79899 Other long term (current) drug therapy: Secondary | ICD-10-CM

## 2021-09-16 DIAGNOSIS — Z87891 Personal history of nicotine dependence: Secondary | ICD-10-CM

## 2021-09-16 DIAGNOSIS — Z5111 Encounter for antineoplastic chemotherapy: Secondary | ICD-10-CM | POA: Insufficient documentation

## 2021-09-16 DIAGNOSIS — D709 Neutropenia, unspecified: Secondary | ICD-10-CM

## 2021-09-16 LAB — COMPREHENSIVE METABOLIC PANEL, NON-FASTING
ALBUMIN: 3.8 g/dL (ref 3.5–5.0)
ALKALINE PHOSPHATASE: 44 U/L — ABNORMAL LOW (ref 45–115)
ALT (SGPT): 25 U/L (ref 10–55)
ANION GAP: 9 mmol/L (ref 4–13)
AST (SGOT): 21 U/L (ref 8–45)
BILIRUBIN TOTAL: 0.4 mg/dL (ref 0.3–1.3)
BUN/CREA RATIO: 10 (ref 6–22)
BUN: 9 mg/dL (ref 8–25)
CALCIUM: 9 mg/dL (ref 8.5–10.0)
CHLORIDE: 104 mmol/L (ref 96–111)
CO2 TOTAL: 25 mmol/L (ref 22–30)
CREATININE: 0.93 mg/dL (ref 0.75–1.35)
ESTIMATED GFR: >90 mL/min/BSA (ref >=60)
GLUCOSE: 200 mg/dL — ABNORMAL HIGH (ref 65–125)
POTASSIUM: 3.8 mmol/L (ref 3.5–5.1)
PROTEIN TOTAL: 6.6 g/dL (ref 6.4–8.3)
SODIUM: 138 mmol/L (ref 136–145)

## 2021-09-16 LAB — CBC WITH DIFF
BASOPHIL #: <0.1 10*3/uL (ref <=0.20)
BASOPHIL %: 2 %
EOSINOPHIL #: <0.1 10*3/uL (ref <=0.50)
EOSINOPHIL %: 4 %
HCT: 36.6 % — ABNORMAL LOW (ref 38.9–52.0)
HGB: 12.8 g/dL — ABNORMAL LOW (ref 13.4–17.5)
IMMATURE GRANULOCYTE #: <0.1 10*3/uL (ref <0.10)
IMMATURE GRANULOCYTE %: 0 % (ref 0–1)
LYMPHOCYTE #: 1.23 10*3/uL (ref 1.00–4.80)
LYMPHOCYTE %: 59 %
MCH: 27.6 pg (ref 26.0–32.0)
MCHC: 35 g/dL (ref 31.0–35.5)
MCV: 79 fL (ref 78.0–100.0)
MONOCYTE #: 0.37 10*3/uL (ref 0.20–1.10)
MONOCYTE %: 18 %
MPV: 8.8 fL (ref 8.7–12.5)
NEUTROPHIL #: 0.34 10*3/uL — ABNORMAL LOW (ref 1.50–7.70)
NEUTROPHIL %: 17 %
PLATELETS: 229 10*3/uL (ref 150–400)
RBC: 4.63 10*6/uL (ref 4.50–6.10)
RDW-CV: 15.1 % (ref 11.5–15.5)
WBC: 2.1 10*3/uL — ABNORMAL LOW (ref 3.7–11.0)

## 2021-09-16 LAB — ECG 12-LEAD
Atrial Rate: 80 {beats}/min
Calculated P Axis: 36 degrees
Calculated R Axis: 58 degrees
Calculated T Axis: 53 degrees
PR Interval: 142 ms
QRS Duration: 80 ms
QT Interval: 368 ms
QTC Calculation: 424 ms
Ventricular rate: 80 {beats}/min

## 2021-09-16 LAB — PLATELETS AND ANC CANCER CENTER
NEUTROPHILS #: 0.34 10*3/uL — ABNORMAL LOW (ref 1.50–7.70)
PLATELET COUNT: 229 10*3/uL (ref 150–400)

## 2021-09-16 LAB — LDH: LDH: 208 U/L (ref 125–220)

## 2021-09-16 MED ORDER — DIPHENHYDRAMINE 50 MG/ML INJECTION SOLUTION
25.0000 mg | Freq: Once | INTRAMUSCULAR | Status: DC | PRN
Start: 2021-09-16 — End: 2021-09-17

## 2021-09-16 MED ORDER — DOXORUBICIN 2 MG/ML INTRAVENOUS SOLUTION
25.0000 mg/m2 | Freq: Once | INTRAVENOUS | Status: AC
Start: 2021-09-16 — End: 2021-09-16
  Administered 2021-09-16: 52.8 mg via INTRAVENOUS
  Filled 2021-09-16: qty 26.4

## 2021-09-16 MED ORDER — ALBUTEROL SULFATE HFA 90 MCG/ACTUATION AEROSOL INHALER - RN
2.0000 | Freq: Once | RESPIRATORY_TRACT | Status: DC | PRN
Start: 2021-09-16 — End: 2021-09-17

## 2021-09-16 MED ORDER — DIPHENHYDRAMINE 50 MG/ML INJECTION SOLUTION
50.0000 mg | Freq: Once | INTRAMUSCULAR | Status: DC | PRN
Start: 2021-09-16 — End: 2021-09-17

## 2021-09-16 MED ORDER — SODIUM CHLORIDE 0.9 % INTRAVENOUS SOLUTION
10.0000 [IU]/m2 | Freq: Once | INTRAVENOUS | Status: AC
Start: 2021-09-16 — End: 2021-09-16
  Administered 2021-09-16: 21 [IU] via INTRAVENOUS
  Administered 2021-09-16: 0 [IU] via INTRAVENOUS
  Filled 2021-09-16: qty 7

## 2021-09-16 MED ORDER — EPINEPHRINE 1 MG/ML (1 ML) INJECTION SOLUTION
0.3000 mg | Freq: Once | INTRAMUSCULAR | Status: DC | PRN
Start: 2021-09-16 — End: 2021-09-17

## 2021-09-16 MED ORDER — SODIUM CHLORIDE 0.9 % INTRAVENOUS SOLUTION
375.0000 mg/m2 | Freq: Once | INTRAVENOUS | Status: AC
Start: 2021-09-16 — End: 2021-09-16
  Administered 2021-09-16: 0 mg via INTRAVENOUS
  Administered 2021-09-16: 791.3 mg via INTRAVENOUS
  Filled 2021-09-16: qty 79.13

## 2021-09-16 MED ORDER — FAMOTIDINE (PF) 20 MG/2 ML INTRAVENOUS SOLUTION
20.0000 mg | Freq: Once | INTRAVENOUS | Status: DC | PRN
Start: 2021-09-16 — End: 2021-09-17

## 2021-09-16 MED ORDER — MEPERIDINE (PF) 25 MG/ML INJECTION SOLUTION
12.5000 mg | Freq: Once | INTRAMUSCULAR | Status: DC | PRN
Start: 2021-09-16 — End: 2021-09-17

## 2021-09-16 MED ORDER — SODIUM CHLORIDE 0.9 % INTRAVENOUS SOLUTION
INTRAVENOUS | Status: DC
Start: 2021-09-16 — End: 2021-09-17

## 2021-09-16 MED ORDER — SULFAMETHOXAZOLE 800 MG-TRIMETHOPRIM 160 MG TABLET
1.0000 | ORAL_TABLET | ORAL | 3 refills | Status: DC
Start: 2021-09-17 — End: 2021-10-17

## 2021-09-16 MED ORDER — SUMATRIPTAN 100 MG TABLET
100.0000 mg | ORAL_TABLET | Freq: Once | ORAL | 1 refills | Status: DC | PRN
Start: 2021-09-16 — End: 2021-12-01

## 2021-09-16 MED ORDER — ACETAMINOPHEN 325 MG TABLET
650.0000 mg | ORAL_TABLET | Freq: Once | ORAL | Status: AC
Start: 2021-09-16 — End: 2021-09-16
  Administered 2021-09-16: 650 mg via ORAL
  Filled 2021-09-16: qty 2

## 2021-09-16 MED ORDER — HYDROCORTISONE SOD SUCCINATE 100 MG/2 ML VIAL WRAPPER
100.0000 mg | Freq: Once | INTRAMUSCULAR | Status: DC | PRN
Start: 2021-09-16 — End: 2021-09-17

## 2021-09-16 MED ORDER — DEXAMETHASONE 4 MG TABLET
20.0000 mg | ORAL_TABLET | Freq: Once | ORAL | Status: AC
Start: 2021-09-16 — End: 2021-09-16
  Administered 2021-09-16: 20 mg via ORAL
  Filled 2021-09-16: qty 5

## 2021-09-16 MED ORDER — PALONOSETRON 0.25 MG/5 ML INTRAVENOUS SOLUTION
0.2500 mg | Freq: Once | INTRAVENOUS | Status: AC
Start: 2021-09-16 — End: 2021-09-16
  Administered 2021-09-16: 0.25 mg via INTRAVENOUS
  Filled 2021-09-16: qty 5

## 2021-09-16 MED ORDER — VINBLASTINE 1 MG/ML INTRAVENOUS SOLUTION
6.0000 mg/m2 | Freq: Once | INTRAVENOUS | Status: AC
Start: 2021-09-16 — End: 2021-09-16
  Administered 2021-09-16: 12.7 mg via INTRAVENOUS
  Administered 2021-09-16: 0 mg via INTRAVENOUS
  Filled 2021-09-16: qty 12.7

## 2021-09-16 MED ORDER — SENNOSIDES 8.6 MG-DOCUSATE SODIUM 50 MG TABLET
1.0000 | ORAL_TABLET | Freq: Two times a day (BID) | ORAL | 5 refills | Status: DC
Start: 2021-09-16 — End: 2021-10-17

## 2021-09-16 MED ORDER — ALBUTEROL SULFATE 2.5 MG/3 ML (0.083 %) SOLUTION FOR NEBULIZATION
2.5000 mg | INHALATION_SOLUTION | Freq: Once | RESPIRATORY_TRACT | Status: DC | PRN
Start: 2021-09-16 — End: 2021-09-17

## 2021-09-16 NOTE — Addendum Note (Signed)
Addended by: Lannie Fields on: 09/16/2021 02:02 PM     Modules accepted: Orders

## 2021-09-16 NOTE — Nurses Notes (Addendum)
1315: Patient to VAD for labs prior to clinic and infusion appointment. Port accessed using sterile technique with 20G 3/4" huber-needle. Labs collected and sent for processing. Flushed with 20 mls NS, +blood return present. Dressing and curos caps applied. Patient ambulatory out of VAD to ground floor waiting area at this time. Edmonia Caprio, RN  [phone]-- Patient with no complaints. No parameters, orders released at this time. [place], RN  [phone] Pre-meds given. 30 minute wait started. Maylon Cos, RN  1526:30 minute wait completed.    Bleomycin begun per [facility] via port with good blood return. Chemotherapy policy and procedure followed. Verified during chemotherapy double read that chemotherapy is proper color and consistency, as well as, chemotherapy has not expired.  Verified during double read that treatment sequencing is correct. Chemotherapy reviewed by 2 RN's. Pump rate was visually confirmed with H.Ramey,RN.  Pt has no complaints at this time.  Call bell within reach. Maylon Cos, RN  [phone]: Bleomycin completed. IV of NSS was established that drips rapidly to gravity. Adriamycin begun via port with good blood return. Adriamycin given IV Push via IV line of NSS.   Chemotherapy policy and procedure followed. Verified during chemotherapy double read that chemotherapy is proper color and consistency, as well as, chemotherapy has not expired.  Verified during double read that treatment sequencing is correct. Chemotherapy reviewed by 2 RN's (H.Ramey, RN). Maylon Cos, RN  [phone]: Adriamycin completed without incidence per [facility] time; pt tolerated well. Velban begun per [facility] via port with good blood return. Chemotherapy policy and procedure followed. Chemotherapy reviewed by 2 RN's. Verified during chemotherapy double read that chemotherapy is proper color and consistency, as well as, chemotherapy has not expired. Verified during double read that treatment sequencing is correct.  Pump rate was visually confirmed  with H.Ramey,RN. Pt has no complaints at this time. Call bell within reach. Maylon Cos, RN  [phone]: Velban completed without incidence per [facility] time; pt tolerated well. Dacarbazine begun per [facility] via port with good blood return. Chemotherapy policy and procedure followed. Chemotherapy reviewed by 2 RN's. Verified during chemotherapy double read that chemotherapy is proper color and consistency, as well as, chemotherapy has not expired. Verified during double read that treatment sequencing is correct.  Pump rate was visually confirmed with H.Ramey,RN. Pt has no complaints at this time. Call bell within reach. Maylon Cos, RN  [phone] Dacarbazine completed without incident. Port flushed and de-accessed, adhesive bandage applied. Patient left ambulatory from infusion. Maylon Cos, RN

## 2021-09-16 NOTE — Progress Notes (Addendum)
Pharmacy Note:  Chemotherapy Second Cycle Follow-up    Jason Huffman is a 46 y.o. male with cHL who I am seeing prior to the second cycle of ABVD.     After the first cycle of ABVD, patient reports that overall, he feels okay. Patient does report fatigue - he does not like resting and sometimes feels better doing his daily activities but ends up "paying for it" later with increased fatigue. He also has nausea from time to time which is helped with Compazine. Constipation was also reported, so we will increase his Senokot-S to BID.Denies fevers.    Per provider, will add PJP prophylaxis while neutropenic using Bactrim every M/W/F.    Lastly, will send sumatriptan for ~1 fill until he can get in with a PCP.    Patient verbalized understanding to let us know if anything worsens with continued cycles.  Approximate time spent with patient: 10 minutes      Erick Oxendine K. Warnell Forester, PharmD, [place]  Oncology Pharmacy Specialist  Pager: [phone]  09/16/2021 1:56 PM

## 2021-09-22 ENCOUNTER — Encounter (HOSPITAL_BASED_OUTPATIENT_CLINIC_OR_DEPARTMENT_OTHER): Payer: Self-pay | Admitting: Internal Medicine

## 2021-09-22 ENCOUNTER — Other Ambulatory Visit (HOSPITAL_BASED_OUTPATIENT_CLINIC_OR_DEPARTMENT_OTHER): Payer: Self-pay

## 2021-09-22 DIAGNOSIS — C8190 Hodgkin lymphoma, unspecified, unspecified site: Secondary | ICD-10-CM

## 2021-09-22 MED ORDER — OLANZAPINE 5 MG TABLET
5.0000 mg | ORAL_TABLET | Freq: Every evening | ORAL | 0 refills | Status: DC
Start: 2021-09-22 — End: 2021-10-14

## 2021-09-24 ENCOUNTER — Other Ambulatory Visit (HOSPITAL_BASED_OUTPATIENT_CLINIC_OR_DEPARTMENT_OTHER): Payer: Self-pay | Admitting: Internal Medicine

## 2021-09-24 NOTE — Cancer Center Note (Signed)
Department of Hematology/Oncology  Return Patient Visit      Date: 09/16/2021  Name: Jason Huffman  MRN: [id number]  Referring Physician: [name], FNP-C  Primary Care Provider: Elliot Gault, DO    Reason for visit/consultation No chief complaint on file.      History of Present Illness:  Information Obtained from: patient and spouse  1. [name] is a 46 year old man who presents for evaluation of Hodgkin Lymphoma.  He noted that for the past year he hasn't been feeling right, that his energy hasn't been good.  He also started itching, and saw multiple doctors about the itching and received medications and creams for it.  In the fall he developed intermittent chest pain, which worried him because both his parents died young of heart disease. In November started his evaluation and ultimately had a CT chest, which showed a large mediastinal mass, 12 cm, with associated lymphadenopathy.  He had a biopsy done, which showed classical Hodgkin Lymphoma.  He denies fevers, chills, night sweats.  His main complaints are fatigue, itching, and intermittent chest pain, which is sharp and positional.  2. Evalution at [facility] on 08/04/2021 with WBC 11.9, HGB 14.2, PLC 308K, ANC 9.84, ALC 540, ESR 11, creatinine 0.88, LDH 238, albumin 3.8, Hep B immunized, HIV and Hep C negative, TTE on 08/15/21 with EF of 60-65% with normal diastolic function. PFT's with FEV1 of 91% and DLCOunc 94%.     Interval History:  [name] presents today with his wife. He is overall doing well, but he continues to have pericardial chest pain.  He feels better on steroids, but it comes back when the steroids are done.  No palpitations, shortness of breath or cough. No abdominal pain, constipation or diarrhea. No fevers, chills or night sweats.     Past Medical History  Past Medical History:   Diagnosis Date   . Hodgkin lymphoma (CMS HCC)        No Known Allergies  Current Outpatient Medications   Medication Sig   . allopurinoL  (ZYLOPRIM) 300 mg Oral Tablet Take 1 Tablet (300 mg total) by mouth Once a day for 4 days   . busPIRone (BUSPAR) 5 mg Oral Tablet Take 1 Tablet (5 mg total) by mouth Twice daily   . cyclobenzaprine (FLEXERIL) 10 mg Oral Tablet Take 1 Tablet (10 mg total) by mouth Three times a day   . dexAMETHasone (DECADRON) 4 mg Oral Tablet Take 1 Tablet (4 mg total) by mouth Once a day (Patient not taking: Reported on 08/18/2021)   . gabapentin (NEURONTIN) 300 mg Oral Capsule Take 300 mg by mouth (Patient not taking: Reported on 08/04/2021)   . hydrOXYzine HCL (ATARAX) 25 mg Oral Tablet Take 1 Tablet (25 mg total) by mouth Three times a day   . Ibuprofen (MOTRIN) 600 mg Oral Tablet Take 1 Tablet (600 mg total) by mouth Four times a day as needed for Pain   . lidocaine-prilocaine (EMLA) 2.5-2.5 % Cream Apply a thick layer directly over port then cover with a protective covering at least 60 min prior to port access.   Marland Kitchen OLANZapine (ZYPREXA) 5 mg Oral Tablet Take 1 Tablet (5 mg total) by mouth Every night for 30 days   . omeprazole (PRILOSEC) 20 mg Oral Capsule, Delayed Release(E.C.) Take 1 Capsule (20 mg total) by mouth Once a day   . ondansetron (ZOFRAN) 8 mg Oral Tablet Take 1 Tablet (8 mg total) by mouth Every 8 hours as needed  for Nausea/Vomiting Indications: prevent nausea and vomiting from cancer chemotherapy   . prochlorperazine (COMPAZINE) 10 mg Oral Tablet Take 1 Tablet (10 mg total) by mouth Every 6 hours as needed for Nausea/Vomiting Indications: prevent nausea and vomiting from cancer chemotherapy   . sennosides-docusate sodium (SENNA WITH DOCUSATE SODIUM) 8.6-50 mg Oral Tablet Take 1 Tablet by mouth Twice daily Indications: constipation   . sumatriptan succinate (IMITREX) 100 mg Oral Tablet Take 1 Tablet (100 mg total) by mouth Once, as needed for Migraine May repeat in 2 hours in needed. Max dose of 2 in 24 hours Indications: a migraine headache   . trimethoprim-sulfamethoxazole (BACTRIM DS) 160-839m per tablet Take 1  Tablet (160 mg total) by mouth Every Monday, Wednesday and Friday Indications: Pneumocystis jiroveci pneumonia prevention   . zolpidem (AMBIEN) 10 mg Oral Tablet Take 1 Tablet (10 mg total) by mouth Every night       Family History  Family Medical History:     Problem Relation (Age of Onset)    Coronary Artery Disease Mother, Father          Social History  Occupation:   Social History     Occupational History   . Occupation: School bus driver    [id number]  reports that he quit smoking about 16 years ago. His smoking use included cigarettes. He has never used smokeless tobacco. He reports that he does not drink alcohol and does not use drugs.  ROS: Other than ROS in the HPI, all other systems were negative.    Physical Examination:  [place]  Temperature 36.4 C (97.6 F) filed at... 09/16/2021 1338   Heart Rate 97 filed at... 09/16/2021 1338   Respiratory Rate 18 filed at... 09/16/2021 1338   BP (Non-Invasive) 140/92 filed at... 09/16/2021 1338   SpO2 98 % filed at... 09/16/2021 1338   Height 1.753 m ('5\' 9"' ) filed at... 09/16/2021 1338   Weight 92.3 kg (203 lb 7.8 oz) filed at... 09/16/2021 1338   BMI (Calculated) 30.11 filed at... 09/16/2021 1338   BSA (Calculated) 2.12 filed at... 09/16/2021 1338   ECOG Status: 0 - Fully active, able to carry on all pre-disease performance without restriction.     General: appears in good health  Eyes: Conjunctiva clear.  Neck: no adenopathy  Lungs: breathing comfortably  Cardiovascular: Regular rate and rhythm  Abdomen: non-distended  Extremities: no cyanosis or edema  Skin: warm and dry  Neurologic: grossly normal  Lymphatics: No nodes appreciated  Psychiatric: AOx3  Access:  No current access site    Labs  COMPLETE BLOOD COUNT   Lab Results   Component Value Date    WBC 2.1 (L) 09/16/2021    HGB 12.8 (L) 09/16/2021    HCT 36.6 (L) 09/16/2021     PLTCNT 229 09/16/2021    PLTCNT 229 09/16/2021       DIFFERENTIAL  Lab Results   Component Value Date    PMNS 17 09/16/2021    LYMPHOCYTES 61 09/02/2021    MYELOCYTES 2 09/02/2021    MONOCYTES 18 09/16/2021    EOSINOPHIL 2 09/02/2021    BASOPHILS 2 09/16/2021    BASOPHILS <0.10 09/16/2021    PMNABS 0.34 (L) 09/16/2021    PMNABS 0.34 (L) 09/16/2021    LYMPHSABS 1.23 09/16/2021    EOSABS <0.10 09/16/2021    MONOSABS 0.37 09/16/2021  COMPREHENSIVE METABOLIC PANEL - NON FASTING  Lab Results   Component Value Date    SODIUM 138 09/16/2021    POTASSIUM 3.8 09/16/2021    CHLORIDE 104 09/16/2021    CO2 25 09/16/2021    ANIONGAP 9 09/16/2021    BUN 9 09/16/2021    CREATININE 0.93 09/16/2021    GLUCOSENF 200 (H) 09/16/2021    CALCIUM 9.0 09/16/2021    ALBUMIN 3.8 09/16/2021    TOTALPROTEIN 6.6 09/16/2021    ALKPHOS 44 (L) 09/16/2021    AST 21 09/16/2021    ALT 25 09/16/2021       Pathology:  Slide reviewed at [facility]:  pending      Radiology  Outside CT chest reviewed by me, large anterior mediastinal mass    Assessment/Plan:   Mr. Arnez Stoneking is a 46 y.o. male who presents for evaluation of Hodgkin lymphoma.    1. Hodgkin lymphoma: Underlying diagnosis again discussed with patient. Pathology still being reviewed at [facility] but based on outside pathology diagnosis is consistent with classical Hodgkin Lymphoma. Based on imaging he has Stage IIAX disease, due to the size of his mass and concern for pericardial invasion, Dr. Harrington Challenger is planning to treat with 6 cycles of PET adapted ABVD per RATHL trial follwoed by radiation to site of bulk. TTE within normal limits. PFT's also WNL. Verbal and written consent obtained today. Patient has no interest in fertility preservation.  Proceed with C2D1 of ABVD  - 09/02/2021: Patient had originally requested to see Dr. Durene Romans as [facility] is closer for patient. At the time, patient was unable to be referred and it was requested that treatment be initiated here. Dr. Harrington Challenger spoke with Dr.  Durene Romans today regarding transfer. Decision made to continue treatment here through second cycle with follow-up PET. If resolution or large improvement in disease status per PET, will transfer to [facility] for remaining cycles. If minimal improvement or no improvement, will continue here. Patient in agreement with plan. He currently has an appointment with Dr. Durene Romans planned for May 8th, 2023.    - 09/16/21: Proceed with C2D1 today, after this cycle will get PET and then transfer care to Dr. Greggory Stallion.    2. Chest pain: Seems like pericarditis from local mass. Prescribed dexamethasone 4 mg po daily x 7 days with improvement  - Patient recently finished another course of steroids on Sunday for chest pain. Discussed the risk/benefit of frequent steroid use, and that with treatment pain should resolve as it is r/t to mass.   - Will touch base with cardiology re: other treatment options    3. ID: Start Bactrim for PCP ppx in setting of neutropenia.    Disposition:  RTC in two weeks for C2D15 of ABVD, MD visit.     On the day of the encounter, a total of  30 minutes was spent on this patient encounter including review of historical information, examination, documentation and post-visit activities. The time documented excludes procedural time.      Katheren Puller, MD  09/24/2021, 13:12      CC:  Elliot Gault, DO  [place] [id number]    No primary care provider on file.

## 2021-09-30 ENCOUNTER — Other Ambulatory Visit: Payer: Self-pay

## 2021-09-30 ENCOUNTER — Inpatient Hospital Stay: Payer: 59 | Attending: Internal Medicine

## 2021-09-30 ENCOUNTER — Inpatient Hospital Stay
Admission: RE | Admit: 2021-09-30 | Discharge: 2021-09-30 | Disposition: A | Discharge status: Home / Self Care | Payer: 59 | Source: Ambulatory Visit | Attending: Internal Medicine | Admitting: Internal Medicine

## 2021-09-30 VITALS — BP 130/90 | HR 83 | Temp 98.2°F | Resp 17 | Ht 69.02 in | Wt 203.5 lb

## 2021-09-30 DIAGNOSIS — C8190 Hodgkin lymphoma, unspecified, unspecified site: Secondary | ICD-10-CM | POA: Insufficient documentation

## 2021-09-30 DIAGNOSIS — Z5111 Encounter for antineoplastic chemotherapy: Secondary | ICD-10-CM | POA: Insufficient documentation

## 2021-09-30 MED ORDER — PROCHLORPERAZINE EDISYLATE 10 MG/2 ML (5 MG/ML) INJECTION SOLUTION
10.0000 mg | Freq: Once | INTRAMUSCULAR | Status: AC
Start: 2021-09-30 — End: 2021-09-30
  Administered 2021-09-30: 10 mg via INTRAVENOUS
  Filled 2021-09-30: qty 2

## 2021-09-30 MED ORDER — ALBUTEROL SULFATE 2.5 MG/3 ML (0.083 %) SOLUTION FOR NEBULIZATION
2.5000 mg | INHALATION_SOLUTION | Freq: Once | RESPIRATORY_TRACT | Status: DC | PRN
Start: 2021-09-30 — End: 2021-10-01

## 2021-09-30 MED ORDER — DOXORUBICIN 2 MG/ML INTRAVENOUS SOLUTION
25.0000 mg/m2 | Freq: Once | INTRAVENOUS | Status: AC
Start: 2021-09-30 — End: 2021-09-30
  Administered 2021-09-30: 52.8 mg via INTRAVENOUS
  Filled 2021-09-30: qty 26.4

## 2021-09-30 MED ORDER — SODIUM CHLORIDE 0.9 % INTRAVENOUS SOLUTION
375.0000 mg/m2 | Freq: Once | INTRAVENOUS | Status: AC
Start: 2021-09-30 — End: 2021-09-30
  Administered 2021-09-30: 791.3 mg via INTRAVENOUS
  Administered 2021-09-30: 0 mg via INTRAVENOUS
  Filled 2021-09-30: qty 79.13

## 2021-09-30 MED ORDER — SODIUM CHLORIDE 0.9 % INTRAVENOUS SOLUTION
10.0000 [IU]/m2 | Freq: Once | INTRAVENOUS | Status: AC
Start: 2021-09-30 — End: 2021-09-30
  Administered 2021-09-30: 21 [IU] via INTRAVENOUS
  Administered 2021-09-30: 0 [IU] via INTRAVENOUS
  Filled 2021-09-30: qty 7

## 2021-09-30 MED ORDER — ALBUTEROL SULFATE HFA 90 MCG/ACTUATION AEROSOL INHALER - RN
2.0000 | Freq: Once | RESPIRATORY_TRACT | Status: DC | PRN
Start: 2021-09-30 — End: 2021-10-01

## 2021-09-30 MED ORDER — DIPHENHYDRAMINE 50 MG/ML INJECTION SOLUTION
25.0000 mg | Freq: Once | INTRAMUSCULAR | Status: DC | PRN
Start: 2021-09-30 — End: 2021-10-01

## 2021-09-30 MED ORDER — ACETAMINOPHEN 325 MG TABLET
650.0000 mg | ORAL_TABLET | Freq: Once | ORAL | Status: AC
Start: 2021-09-30 — End: 2021-09-30
  Administered 2021-09-30: 650 mg via ORAL
  Filled 2021-09-30: qty 2

## 2021-09-30 MED ORDER — HYDROCORTISONE SOD SUCCINATE 100 MG/2 ML VIAL WRAPPER
100.0000 mg | Freq: Once | INTRAMUSCULAR | Status: DC | PRN
Start: 2021-09-30 — End: 2021-10-01

## 2021-09-30 MED ORDER — VINBLASTINE 1 MG/ML INTRAVENOUS SOLUTION
6.0000 mg/m2 | Freq: Once | INTRAVENOUS | Status: AC
Start: 2021-09-30 — End: 2021-09-30
  Administered 2021-09-30: 0 mg via INTRAVENOUS
  Administered 2021-09-30: 12.7 mg via INTRAVENOUS
  Filled 2021-09-30: qty 12.7

## 2021-09-30 MED ORDER — PALONOSETRON 0.25 MG/5 ML INTRAVENOUS SOLUTION
0.2500 mg | Freq: Once | INTRAVENOUS | Status: AC
Start: 2021-09-30 — End: 2021-09-30
  Administered 2021-09-30: 0.25 mg via INTRAVENOUS
  Filled 2021-09-30: qty 5

## 2021-09-30 MED ORDER — FAMOTIDINE (PF) 20 MG/2 ML INTRAVENOUS SOLUTION
20.0000 mg | Freq: Once | INTRAVENOUS | Status: DC | PRN
Start: 2021-09-30 — End: 2021-10-01

## 2021-09-30 MED ORDER — EPINEPHRINE 1 MG/ML (1 ML) INJECTION SOLUTION
0.3000 mg | Freq: Once | INTRAMUSCULAR | Status: DC | PRN
Start: 2021-09-30 — End: 2021-10-01

## 2021-09-30 MED ORDER — SODIUM CHLORIDE 0.9% FLUSH BAG - 250 ML
INTRAVENOUS | Status: DC | PRN
Start: 2021-09-30 — End: 2021-10-01

## 2021-09-30 MED ORDER — MEPERIDINE (PF) 25 MG/ML INJECTION SOLUTION
12.5000 mg | Freq: Once | INTRAMUSCULAR | Status: DC | PRN
Start: 2021-09-30 — End: 2021-10-01

## 2021-09-30 MED ORDER — DIPHENHYDRAMINE 50 MG/ML INJECTION SOLUTION
50.0000 mg | Freq: Once | INTRAMUSCULAR | Status: DC | PRN
Start: 2021-09-30 — End: 2021-10-01

## 2021-09-30 MED ORDER — DEXAMETHASONE 4 MG TABLET
20.0000 mg | ORAL_TABLET | Freq: Once | ORAL | Status: AC
Start: 2021-09-30 — End: 2021-09-30
  Administered 2021-09-30: 20 mg via ORAL
  Filled 2021-09-30: qty 5

## 2021-09-30 NOTE — Nurses Notes (Addendum)
Patient to VAD for port access prior to Infusion visit.  Patient right port accessed using sterile technique; positive blood noted.  No labs ordered for today.  Port flushed with 30 cc NS and curos caps applied.  Patient ambulatory out of VAD with steady gait. Myles Rosenthal, RN  1235 Pt to Infusion area for ABVD. No labs or treatment parameters noted. Orders released. Francee Gentile RN  4481 Pt to room #5.  Educated.  Estevan Oaks, RN  8563 Premedicated per [facility]-- via port + flush and blood return.  Placed on 30 min wait.  Estevan Oaks, RN  1357- 30 minute wait completed.    Bleomycin begun per [facility] via port with good blood return. Chemotherapy policy and procedure followed. Verified during chemotherapy double read that chemotherapy is proper color and consistency, as well as, chemotherapy has not expired.  Verified during double read that treatment sequencing is correct. Chemotherapy reviewed by 2 RN's. Pump rate was visually confirmed with Carmela Hurt.  Pt has no complaints at this time.  Call bell within reach. Estevan Oaks, RN  [phone]- Bleomycin completed. IV of NSS was established that drips rapidly to gravity. Adriamycin begun via port with good blood return. Adriamycin given IV Push via IV line of NSS.   Chemotherapy policy and procedure followed. Verified during chemotherapy double read that chemotherapy is proper color and consistency, as well as, chemotherapy has not expired.  Verified during double read that treatment sequencing is correct. Chemotherapy reviewed by 2 RN's. Gardiner Sleeper, RN  [phone]- Adriamycin completed without incidence per [facility] time; pt tolerated well. Velban begun per [facility] via port with good blood return. Chemotherapy policy and procedure followed. Chemotherapy reviewed by 2 RN's. Verified during chemotherapy double read that chemotherapy is proper color and consistency, as well as, chemotherapy has not expired. Verified during double read that treatment sequencing is correct.  Pump rate  was visually confirmed with Ileene Rubens. Pt has no complaints at this time. Call bell within reach. Gardiner Sleeper, RN  Olla completed without incidence per [facility] time; pt tolerated well. DTIC-DOme begun per [facility] via port with good blood return. Chemotherapy policy and procedure followed. Chemotherapy reviewed by 2 RN's. Verified during chemotherapy double read that chemotherapy is proper color and consistency, as well as, chemotherapy has not expired. Verified during double read that treatment sequencing is correct.  Pump rate was visually confirmed with Nickie Retort. Pt has no complaints at this time. Call bell within reach. Gardiner Sleeper, RN  Patient nauseous and wanting something paged the team. Gardiner Sleeper, RN  [phone]- Compazine ordered, DTIC_DOME completed without incident. The patient voices no questions or concerns regarding today's treatment.  Port flushed with 20 ml of NSS and huber needle removed intact and bandage applied to site. Pt left infusion ambulatory. Gardiner Sleeper, RN

## 2021-10-07 ENCOUNTER — Other Ambulatory Visit: Payer: Self-pay

## 2021-10-07 ENCOUNTER — Inpatient Hospital Stay
Admission: RE | Admit: 2021-10-07 | Discharge: 2021-10-07 | Disposition: A | Discharge status: Home / Self Care | Payer: 59 | Source: Ambulatory Visit

## 2021-10-07 DIAGNOSIS — C8190 Hodgkin lymphoma, unspecified, unspecified site: Secondary | ICD-10-CM | POA: Insufficient documentation

## 2021-10-07 DIAGNOSIS — R599 Enlarged lymph nodes, unspecified: Secondary | ICD-10-CM | POA: Insufficient documentation

## 2021-10-07 DIAGNOSIS — Z79899 Other long term (current) drug therapy: Secondary | ICD-10-CM | POA: Insufficient documentation

## 2021-10-07 LAB — POC BLOOD GLUCOSE (RESULTS): GLUCOSE, POC: 133 mg/dl — ABNORMAL HIGH (ref 70–105)

## 2021-10-07 MED ORDER — DIATRIZOATE MEGLUMINE-DIATRIZOATE SODIUM 66 %-10 % ORAL SOLUTION
10.0000 mL | ORAL | Status: AC
Start: 2021-10-07 — End: 2021-10-07
  Administered 2021-10-07: 10 mL via ORAL

## 2021-10-07 MED ORDER — IOPAMIDOL 300 MG IODINE/ML (61 %) INTRAVENOUS SOLUTION
100.0000 mL | INTRAVENOUS | Status: AC
Start: 2021-10-07 — End: 2021-10-07
  Administered 2021-10-07: 100 mL via INTRAVENOUS

## 2021-10-08 DIAGNOSIS — C8178 Other classical Hodgkin lymphoma, lymph nodes of multiple sites: Secondary | ICD-10-CM

## 2021-10-08 DIAGNOSIS — C771 Secondary and unspecified malignant neoplasm of intrathoracic lymph nodes: Secondary | ICD-10-CM

## 2021-10-09 ENCOUNTER — Encounter (HOSPITAL_COMMUNITY): Payer: Self-pay | Admitting: Internal Medicine

## 2021-10-09 ENCOUNTER — Other Ambulatory Visit: Payer: Self-pay

## 2021-10-09 ENCOUNTER — Ambulatory Visit: Payer: 59 | Attending: Internal Medicine | Admitting: Internal Medicine

## 2021-10-09 ENCOUNTER — Ambulatory Visit (INDEPENDENT_AMBULATORY_CARE_PROVIDER_SITE_OTHER): Payer: Self-pay | Admitting: Family Medicine

## 2021-10-09 ENCOUNTER — Other Ambulatory Visit (HOSPITAL_COMMUNITY): Payer: Self-pay

## 2021-10-09 DIAGNOSIS — C8190 Hodgkin lymphoma, unspecified, unspecified site: Secondary | ICD-10-CM | POA: Insufficient documentation

## 2021-10-09 DIAGNOSIS — R0781 Pleurodynia: Secondary | ICD-10-CM | POA: Insufficient documentation

## 2021-10-09 MED ORDER — COLCHICINE 0.6 MG TABLET
ORAL_TABLET | ORAL | 2 refills | Status: DC
Start: 2021-10-09 — End: 2022-03-04

## 2021-10-09 NOTE — Cancer Center Note (Signed)
HEMATOLOGY/ONCOLOGY, [facility]  [place] [address]  Operated by [place]  Telephone Visit    Name:  Jason Huffman MRN: [id number]   Date:  10/09/2021 Age:   46 y.o.     The patient/family initiated a request for telephone service.  Verbal consent for this service was obtained from the patient/family.    Last office visit in this department: 09/16/2021      Reason for call: F/u PET/CT for Hodgkin Lymphoma  Call notes:  Called pt to discuss his PET/CT, which looks excellent.  His anterior mediastinal mass is smaller and no longer FDG-avid.  Excellent response to therapy, so will plan to drop bleomycin and proceed with AVD for the final 4 cycles.  Plan for post-treatment PET followed by consolidative radiation to site of bulk, given 12 cm mass.  He will get his subsequent treatment at [facility] in [place].    He also has ongoing pleuritic chest pain, so will try colchicine.  No drug-drug interactions with his chemo.    PET/CT 10/07/21  IMPRESSION:  1. Previously seen hypermetabolic mediastinal lymphadenopathy and more ill-defined hypermetabolic anterior mediastinal mass have shown a marked interval reduction in size and activity, consistent with response to therapy.   2. There is new hypermetabolic activity in a small left inferior paratracheal lymph node at the level of the carina which is stable in size. This may be reactive or malignant.        ICD-10-CM    1. Hodgkin lymphoma (CMS HCC)  C81.90           Total provider time spent with the patient on the phone: 10 minutes.    Katheren Puller, MD

## 2021-10-13 ENCOUNTER — Encounter (HOSPITAL_BASED_OUTPATIENT_CLINIC_OR_DEPARTMENT_OTHER): Payer: Self-pay | Admitting: Internal Medicine

## 2021-10-14 ENCOUNTER — Other Ambulatory Visit (HOSPITAL_BASED_OUTPATIENT_CLINIC_OR_DEPARTMENT_OTHER): Payer: Self-pay

## 2021-10-14 ENCOUNTER — Encounter (INDEPENDENT_AMBULATORY_CARE_PROVIDER_SITE_OTHER): Payer: Self-pay | Admitting: Family

## 2021-10-14 DIAGNOSIS — K219 Gastro-esophageal reflux disease without esophagitis: Secondary | ICD-10-CM

## 2021-10-15 ENCOUNTER — Encounter (HOSPITAL_BASED_OUTPATIENT_CLINIC_OR_DEPARTMENT_OTHER): Payer: Self-pay | Admitting: Internal Medicine

## 2021-10-17 ENCOUNTER — Ambulatory Visit (INDEPENDENT_AMBULATORY_CARE_PROVIDER_SITE_OTHER): Payer: 59 | Admitting: Family

## 2021-10-17 ENCOUNTER — Other Ambulatory Visit: Payer: Self-pay

## 2021-10-17 ENCOUNTER — Encounter (INDEPENDENT_AMBULATORY_CARE_PROVIDER_SITE_OTHER): Payer: Self-pay | Admitting: Family

## 2021-10-17 VITALS — BP 117/80 | HR 68 | Temp 97.1°F | Ht 69.0 in | Wt 206.0 lb

## 2021-10-17 DIAGNOSIS — G4700 Insomnia, unspecified: Secondary | ICD-10-CM

## 2021-10-17 DIAGNOSIS — D72819 Decreased white blood cell count, unspecified: Secondary | ICD-10-CM

## 2021-10-17 DIAGNOSIS — R7309 Other abnormal glucose: Secondary | ICD-10-CM

## 2021-10-17 DIAGNOSIS — E669 Obesity, unspecified: Secondary | ICD-10-CM

## 2021-10-17 DIAGNOSIS — G479 Sleep disorder, unspecified: Secondary | ICD-10-CM

## 2021-10-17 DIAGNOSIS — K219 Gastro-esophageal reflux disease without esophagitis: Secondary | ICD-10-CM

## 2021-10-17 DIAGNOSIS — C8190 Hodgkin lymphoma, unspecified, unspecified site: Secondary | ICD-10-CM

## 2021-10-17 MED ORDER — BELSOMRA 5 MG TABLET
5.0000 mg | ORAL_TABLET | Freq: Every evening | ORAL | 1 refills | Status: DC
Start: 2021-10-17 — End: 2021-10-17

## 2021-10-17 MED ORDER — ESZOPICLONE 2 MG TABLET
2.0000 mg | ORAL_TABLET | Freq: Every evening | ORAL | 0 refills | Status: DC | PRN
Start: 2021-10-17 — End: 2021-10-30

## 2021-10-17 NOTE — Progress Notes (Signed)
FAMILY MEDICINE, MEDICAL OFFICE BUILDING  [place] [address]          Name: Jason Huffman MRN:  [id number]   Date: 10/17/2021 Age: 46 y.o.          Provider: Dan Humphreys, FNP-BC    Reason for visit: New Patient (Former Tomasita Crumble pt; sometimes has high BP) and Sleep Problem (Can fall asleep quickly but gets up about 2 hrs later and then up and down the rest of the night/)      History of Present Illness:  Jason Huffman is a 46 y.o. male presenting to establish care.  Patient has been a previous patient of Dr. Tomasita Crumble.  Patient's chronic medical history includes: Hodgkin Lymphoma, GERD and insomnia.  Patient reports that his BP sometimes increases while he is at his chemo treatments.  Patient states that he has not been taking his Ambien for quite sometime as he was unable to get refills since Dr Tomasita Crumble was leaving but states that it wasn't effective anyway.  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 He believes that this could also contribute to the nightmares. Reports that he is able to fall asleep easily but can't stay asleep for more than a few hours and then has difficulty falling back to sleep.    Historical Data    Past Medical History:  Past Medical History:   Diagnosis Date   . Hodgkin lymphoma (CMS [facility])       Past Surgical History:  Past Surgical History:   Procedure Laterality Date   . FINGER SURGERY Right    . HX LAP CHOLECYSTECTOMY     . MEDIPORT INSERTION, SINGLE Right       Allergies:  No Known Allergies  Medications:  Current Outpatient Medications   Medication Sig   . allopurinoL (ZYLOPRIM) 300 mg Oral Tablet Take 1 Tablet (300 mg total) by mouth Once a day for 4 days   . busPIRone (BUSPAR) 5 mg Oral Tablet Take 1 Tablet (5 mg total) by mouth Twice daily   . colchicine 0.6 mg Oral Tablet Take 2 tabs (1.2 mg) by mouth twice daily on  day 1 followed by 1 tab (0.6 mg) by mouth twice daily thereafter Indications: inflammation of the covering of the heart or pericardium   . cyclobenzaprine (FLEXERIL) 10 mg Oral Tablet Take 1 Tablet (10 mg total) by mouth Three times a day   . hydrOXYzine pamoate (VISTARIL) 25 mg Oral Capsule Take 1 Capsule (25 mg total) by mouth Three times a day   . Ibuprofen (MOTRIN) 600 mg Oral Tablet Take 1 Tablet (600 mg total) by mouth Four times a day as needed for Pain   . naproxen (NAPROSYN) 500 mg Oral Tablet Take 1 Tablet (500 mg total) by mouth Once per day as needed for Pain   . OLANZapine (ZYPREXA) 5 mg Oral Tablet TAKE 1 TABLET BY MOUTH EVERY EVENING   . omeprazole (PRILOSEC) 20 mg Oral Capsule, Delayed Release(E.C.) Take 1 Capsule (20 mg total) by mouth Once a day   . ondansetron (ZOFRAN) 8 mg Oral Tablet Take 1 Tablet (8 mg total) by mouth Every 8 hours as needed for Nausea/Vomiting Indications: prevent nausea and vomiting from cancer chemotherapy   . prochlorperazine (COMPAZINE) 10 mg Oral Tablet Take 1 Tablet (10 mg total) by mouth Every 6 hours as  needed for Nausea/Vomiting Indications: prevent nausea and vomiting from cancer chemotherapy   . sumatriptan succinate (IMITREX) 100 mg Oral Tablet Take 1 Tablet (100 mg total) by mouth Once, as needed for Migraine May repeat in 2 hours in needed. Max dose of 2 in 24 hours Indications: a migraine headache   . suvorexant (BELSOMRA) 5 mg Oral Tablet Take 1 Tablet (5 mg total) by mouth Every night     Family History:  Family Medical History:     Problem Relation (Age of Onset)    Coronary Artery Disease Mother, Father          Social History:  Social History     Socioeconomic History   . Marital status: Married   Occupational History   . Occupation: School bus driver   Tobacco Use   . Smoking status: Former     Types: Cigarettes     Quit date: 2007     Years since quitting: 16.3   . Smokeless tobacco: Never   Substance and Sexual Activity   . Alcohol use: Never   . Drug  use: Never             Review of Systems:  Any pertinent Review of Systems as addressed in the HPI above.    Physical Exam:  Vital Signs:  Vitals:    10/17/21 0945   BP: 117/80   Pulse: 68   Temp: 36.2 C (97.1 F)   TempSrc: Temporal   SpO2: 97%   Weight: 93.4 kg (206 lb)   Height: 1.753 m ('5\' 9"'$ )   BMI: 30.48     Physical Exam  Vitals reviewed.   Constitutional:       General: He is not in acute distress.     Appearance: Normal appearance. He is overweight. He is not ill-appearing, toxic-appearing or diaphoretic.   HENT:      Head: Normocephalic.      Nose: Nose normal.      Mouth/Throat:      Mouth: Mucous membranes are moist.   Eyes:      Extraocular Movements: Extraocular movements intact.      Conjunctiva/sclera: Conjunctivae normal.   Neck:      Thyroid: No thyroid mass, thyromegaly or thyroid tenderness.   Cardiovascular:      Rate and Rhythm: Normal rate and regular rhythm.      Pulses: Normal pulses.      Heart sounds: Normal heart sounds. No murmur heard.  Pulmonary:      Effort: Pulmonary effort is normal. No respiratory distress.      Breath sounds: Normal breath sounds. No wheezing, rhonchi or rales.   Abdominal:      General: Bowel sounds are normal. There is no distension.      Palpations: Abdomen is soft.      Tenderness: There is no abdominal tenderness.   Musculoskeletal:         General: Normal range of motion.      Cervical back: Normal range of motion.      Right lower leg: No edema.      Left lower leg: No edema.   Lymphadenopathy:      Cervical: No cervical adenopathy.   Skin:     General: Skin is warm and dry.      Coloration: Skin is not jaundiced.      Findings: No rash.   Neurological:      General: No focal deficit present.      Mental  Status: He is alert and oriented to person, place, and time.      Motor: No weakness.      Gait: Gait normal.   Psychiatric:         Mood and Affect: Mood normal.         Behavior: Behavior normal.         Thought Content: Thought content normal.          Judgment: Judgment normal.        Data Reviewed today:  Labs: 09/02/2021, 09/16/2021  Radiology: PET scan 10/07/2021  Other provider Notes: Dr Harrington Challenger 10/09/2021, Dr Tomasita Crumble previous notes    Assessment and Plan:  Maguire was seen today for new patient and sleep problem.    Diagnoses and all orders for this visit:    Difficulty sleeping  -     COMPREHENSIVE METABOLIC PANEL, NON-FASTING; Future  -     THYROID STIMULATING HORMONE WITH FREE T4 REFLEX; Future  -     LIPID PANEL; Future    Gastroesophageal reflux disease, unspecified whether esophagitis present  -     COMPREHENSIVE METABOLIC PANEL, NON-FASTING; Future  -     CBC/DIFF; Future    Hodgkin disease (CMS HCC)  -     COMPREHENSIVE METABOLIC PANEL, NON-FASTING; Future  -     THYROID STIMULATING HORMONE WITH FREE T4 REFLEX; Future  -     CBC/DIFF; Future    Insomnia, unspecified type  -     COMPREHENSIVE METABOLIC PANEL, NON-FASTING; Future  -     THYROID STIMULATING HORMONE WITH FREE T4 REFLEX; Future  -     CBC/DIFF; Future  -     suvorexant (BELSOMRA) 5 mg Oral Tablet; Take 1 Tablet (5 mg total) by mouth Every night    Obesity (BMI 30.0-34.9)  -     COMPREHENSIVE METABOLIC PANEL, NON-FASTING; Future  -     CBC/DIFF; Future    Leukopenia, unspecified type  -     COMPREHENSIVE METABOLIC PANEL, NON-FASTING; Future  -     CBC/DIFF; Future       Start Belsomra for sleep.  Will try to approve with patient's insurance.  Patient has appointment with [facility] today as he wants to take his treatments closer to home and was referred there by Dr Harrington Challenger in [place].  Patient's BP normal today, will continue to monitor.  Patient to complete fasting labs prior to next appointment.  I will go ahead and check the patient's Hgb A1c as patient's glucose was 200 on previous labs.  Patient was taking steroids for his mediastinal mass which was likely the cause.  All questions were answered to patient's satisfaction while in the exam room.     Return in about 4 months  (around 02/17/2022) for In Person Visit, Chronic Care Management.    Dan Humphreys, FNP-BC     Portions of this note may be dictated using voice recognition software or a dictation service. Variances in spelling and vocabulary are possible and unintentional. Not all errors are caught/corrected. Please notify the [facility] if any discrepancies are noted or if the meaning of any statement is not clear.

## 2021-10-17 NOTE — Addendum Note (Signed)
Addended by: Dan Humphreys on: 10/17/2021 01:14 PM     Modules accepted: Orders

## 2021-10-17 NOTE — Nursing Note (Signed)
10/17/21 0955   Depression Screen   Little interest or pleasure in doing things. 0   Feeling down, depressed, or hopeless 0   PHQ 2 Total 0

## 2021-10-20 ENCOUNTER — Encounter (HOSPITAL_BASED_OUTPATIENT_CLINIC_OR_DEPARTMENT_OTHER): Payer: Self-pay | Admitting: Internal Medicine

## 2021-10-20 ENCOUNTER — Encounter (INDEPENDENT_AMBULATORY_CARE_PROVIDER_SITE_OTHER): Payer: Self-pay | Admitting: Family

## 2021-10-20 ENCOUNTER — Encounter (HOSPITAL_BASED_OUTPATIENT_CLINIC_OR_DEPARTMENT_OTHER): Payer: Self-pay

## 2021-10-22 ENCOUNTER — Encounter (HOSPITAL_BASED_OUTPATIENT_CLINIC_OR_DEPARTMENT_OTHER): Payer: Self-pay

## 2021-10-24 ENCOUNTER — Encounter (HOSPITAL_BASED_OUTPATIENT_CLINIC_OR_DEPARTMENT_OTHER): Payer: Self-pay

## 2021-10-27 ENCOUNTER — Encounter (HOSPITAL_BASED_OUTPATIENT_CLINIC_OR_DEPARTMENT_OTHER): Payer: Self-pay | Admitting: Internal Medicine

## 2021-10-27 NOTE — Telephone Encounter (Addendum)
FMLA paperwork emailed to stated e-mail, [email or web address]. Lanier Prude, RN        ----- Message from Victorino Dike sent at 10/27/2021 10:51 AM EDT -----  Vincenza Hews from [facility] states that she received the [facility] paperwork for this pt but the fax came over to them completely blacked out. They are requesting this be sent through e-mail if possible to [email or web address]    Thank you

## 2021-10-27 NOTE — Telephone Encounter (Addendum)
Form completed and return to Provider. Lanier Prude, RN      From: Pearson Grippe  To: Katheren Puller, MD  Sent: 10/27/2021  3:14 PM EDT  Subject: Occupational Disease Form    This message is being sent by Carmelina Dane on behalf of Tregan Read.    Hello! On the form Dr. Harrington Challenger signed, there's a question thats missing an answer. It just needs one of the boxes checked. The statue runs out in July so we're on a time frame with this. It's #7. I've reattached the form. If she could please check one of those and send it back we would greatly appreciate it. Thank you!

## 2021-10-30 ENCOUNTER — Other Ambulatory Visit (INDEPENDENT_AMBULATORY_CARE_PROVIDER_SITE_OTHER): Payer: Self-pay | Admitting: Family

## 2021-10-30 DIAGNOSIS — L259 Unspecified contact dermatitis, unspecified cause: Secondary | ICD-10-CM

## 2021-10-30 DIAGNOSIS — G479 Sleep disorder, unspecified: Secondary | ICD-10-CM

## 2021-10-30 MED ORDER — DOXEPIN 6 MG TABLET
6.0000 mg | ORAL_TABLET | Freq: Every evening | ORAL | 1 refills | Status: DC | PRN
Start: 2021-10-30 — End: 2021-11-13

## 2021-11-12 ENCOUNTER — Encounter (HOSPITAL_BASED_OUTPATIENT_CLINIC_OR_DEPARTMENT_OTHER): Payer: Self-pay | Admitting: Internal Medicine

## 2021-11-13 ENCOUNTER — Other Ambulatory Visit (INDEPENDENT_AMBULATORY_CARE_PROVIDER_SITE_OTHER): Payer: Self-pay | Admitting: Family

## 2021-11-13 ENCOUNTER — Encounter (INDEPENDENT_AMBULATORY_CARE_PROVIDER_SITE_OTHER): Payer: Self-pay | Admitting: Family

## 2021-11-13 DIAGNOSIS — C8190 Hodgkin lymphoma, unspecified, unspecified site: Secondary | ICD-10-CM

## 2021-11-13 DIAGNOSIS — G479 Sleep disorder, unspecified: Secondary | ICD-10-CM

## 2021-11-13 MED ORDER — TRAMADOL 50 MG TABLET
1.0000 | ORAL_TABLET | Freq: Three times a day (TID) | ORAL | 0 refills | Status: DC | PRN
Start: 2021-11-13 — End: 2021-12-26

## 2021-11-13 MED ORDER — DOXEPIN 10 MG CAPSULE
10.0000 mg | ORAL_CAPSULE | Freq: Every evening | ORAL | 1 refills | Status: DC
Start: 2021-11-13 — End: 2021-12-05

## 2021-12-01 ENCOUNTER — Other Ambulatory Visit (INDEPENDENT_AMBULATORY_CARE_PROVIDER_SITE_OTHER): Payer: Self-pay | Admitting: Family

## 2021-12-01 DIAGNOSIS — R519 Headache, unspecified: Secondary | ICD-10-CM

## 2021-12-05 ENCOUNTER — Other Ambulatory Visit (INDEPENDENT_AMBULATORY_CARE_PROVIDER_SITE_OTHER): Payer: Self-pay | Admitting: Family

## 2021-12-05 DIAGNOSIS — G479 Sleep disorder, unspecified: Secondary | ICD-10-CM

## 2021-12-05 MED ORDER — DOXEPIN 25 MG CAPSULE
25.0000 mg | ORAL_CAPSULE | Freq: Every evening | ORAL | 0 refills | Status: DC
Start: 2021-12-05 — End: 2021-12-26

## 2021-12-26 ENCOUNTER — Other Ambulatory Visit (INDEPENDENT_AMBULATORY_CARE_PROVIDER_SITE_OTHER): Payer: Self-pay | Admitting: Family Medicine

## 2021-12-26 ENCOUNTER — Other Ambulatory Visit (INDEPENDENT_AMBULATORY_CARE_PROVIDER_SITE_OTHER): Payer: Self-pay | Admitting: Family

## 2021-12-26 ENCOUNTER — Encounter (INDEPENDENT_AMBULATORY_CARE_PROVIDER_SITE_OTHER): Payer: Self-pay | Admitting: Family

## 2021-12-26 DIAGNOSIS — C8190 Hodgkin lymphoma, unspecified, unspecified site: Secondary | ICD-10-CM

## 2021-12-26 DIAGNOSIS — G479 Sleep disorder, unspecified: Secondary | ICD-10-CM

## 2021-12-26 MED ORDER — TRAMADOL 50 MG TABLET
1.0000 | ORAL_TABLET | Freq: Three times a day (TID) | ORAL | 0 refills | Status: AC | PRN
Start: 2021-12-26 — End: 2022-01-09

## 2021-12-26 MED ORDER — DOXEPIN 25 MG CAPSULE
25.0000 mg | ORAL_CAPSULE | Freq: Every evening | ORAL | 0 refills | Status: DC
Start: 2021-12-26 — End: 2022-01-28

## 2021-12-26 NOTE — Telephone Encounter (Signed)
RX approved and encounter closed

## 2021-12-28 ENCOUNTER — Other Ambulatory Visit (HOSPITAL_BASED_OUTPATIENT_CLINIC_OR_DEPARTMENT_OTHER): Payer: Self-pay | Admitting: Internal Medicine

## 2021-12-29 ENCOUNTER — Other Ambulatory Visit (INDEPENDENT_AMBULATORY_CARE_PROVIDER_SITE_OTHER): Payer: Self-pay | Admitting: Family

## 2021-12-29 DIAGNOSIS — T148XXA Other injury of unspecified body region, initial encounter: Secondary | ICD-10-CM

## 2022-01-28 ENCOUNTER — Other Ambulatory Visit (INDEPENDENT_AMBULATORY_CARE_PROVIDER_SITE_OTHER): Payer: Self-pay | Admitting: Family

## 2022-01-28 ENCOUNTER — Encounter (INDEPENDENT_AMBULATORY_CARE_PROVIDER_SITE_OTHER): Payer: Self-pay | Admitting: Family

## 2022-01-28 DIAGNOSIS — G479 Sleep disorder, unspecified: Secondary | ICD-10-CM

## 2022-01-28 MED ORDER — DOXEPIN 50 MG CAPSULE
50.0000 mg | ORAL_CAPSULE | Freq: Every evening | ORAL | 1 refills | Status: DC
Start: 2022-01-28 — End: 2022-03-04

## 2022-02-02 ENCOUNTER — Other Ambulatory Visit (INDEPENDENT_AMBULATORY_CARE_PROVIDER_SITE_OTHER): Payer: Self-pay | Admitting: Family Medicine

## 2022-02-02 DIAGNOSIS — C8190 Hodgkin lymphoma, unspecified, unspecified site: Secondary | ICD-10-CM

## 2022-02-06 ENCOUNTER — Other Ambulatory Visit (INDEPENDENT_AMBULATORY_CARE_PROVIDER_SITE_OTHER): Payer: Self-pay | Admitting: Family

## 2022-02-06 DIAGNOSIS — R519 Headache, unspecified: Secondary | ICD-10-CM

## 2022-02-16 ENCOUNTER — Telehealth (INDEPENDENT_AMBULATORY_CARE_PROVIDER_SITE_OTHER): Payer: Self-pay | Admitting: Family Medicine

## 2022-02-26 ENCOUNTER — Encounter (INDEPENDENT_AMBULATORY_CARE_PROVIDER_SITE_OTHER): Payer: 59 | Admitting: Family

## 2022-03-04 ENCOUNTER — Ambulatory Visit (INDEPENDENT_AMBULATORY_CARE_PROVIDER_SITE_OTHER): Payer: 59 | Admitting: Family

## 2022-03-04 ENCOUNTER — Other Ambulatory Visit: Payer: Self-pay

## 2022-03-04 ENCOUNTER — Encounter (INDEPENDENT_AMBULATORY_CARE_PROVIDER_SITE_OTHER): Payer: Self-pay | Admitting: Family

## 2022-03-04 DIAGNOSIS — G479 Sleep disorder, unspecified: Secondary | ICD-10-CM

## 2022-03-04 MED ORDER — DOXEPIN 50 MG CAPSULE
50.0000 mg | ORAL_CAPSULE | Freq: Every evening | ORAL | 1 refills | Status: AC
Start: 2022-03-04 — End: ?

## 2022-03-04 MED ORDER — AMLODIPINE 5 MG TABLET
10.0000 mg | ORAL_TABLET | Freq: Every day | ORAL | 1 refills | Status: AC
Start: 2022-03-04 — End: ?

## 2022-03-04 MED ORDER — PRAZOSIN 2 MG CAPSULE
2.0000 mg | ORAL_CAPSULE | Freq: Every evening | ORAL | 1 refills | Status: AC
Start: 2022-03-04 — End: ?

## 2022-03-04 NOTE — Progress Notes (Signed)
FAMILY MEDICINE, MEDICAL OFFICE BUILDING  [place] [address]          Name: Jason Huffman MRN:  [id number]   Date: 03/04/2022 Age: 46 y.o.          Provider: Dan Humphreys, FNP-BC    Reason for visit: Follow Up 4 Months      History of Present Illness:  Jason Huffman is a 46 y.o. male presenting for chronic disease management.  Patient was told by his oncologist that his cancer is in remission at this time.  Patient reports that he is still not sleeping well, is having some nightmares that interfere with his sleep.  Interested in starting Prazosin that he has read about.  Patient also concerned that he is slightly tachycardic often.      Historical Data    Past Medical History:  Past Medical History:   Diagnosis Date    Hodgkin lymphoma (CMS [place])       Past Surgical History:  Past Surgical History:   Procedure Laterality Date    FINGER SURGERY Right     HX LAP CHOLECYSTECTOMY      MEDIPORT INSERTION, SINGLE Right       Allergies:  No Known Allergies  Medications:  Current Outpatient Medications   Medication Sig    allopurinoL (ZYLOPRIM) 300 mg Oral Tablet Take 1 Tablet (300 mg total) by mouth Once a day for 4 days    amLODIPine (NORVASC) 5 mg Oral Tablet Take 2 Tablets (10 mg total) by mouth Once a day    cyclobenzaprine (FLEXERIL) 10 mg Oral Tablet TAKE 1 TABLET BY MOUTH THREE TIMES A DAY AS NEEDED    doxepin (SINEQUAN) 50 mg Oral Capsule Take 1 Capsule (50 mg total) by mouth Every night    ELIQUIS 5 mg Oral Tablet Take 1 Tablet (5 mg total) by mouth Twice daily    gabapentin (NEURONTIN) 100 mg Oral Capsule Take 1 Capsule (100 mg total) by mouth Twice daily    hydrOXYzine pamoate (VISTARIL) 25 mg Oral Capsule TAKE 1 CAPSULE BY MOUTH AT BEDTIME    Ibuprofen (MOTRIN) 600 mg Oral Tablet Take 1 Tablet (600 mg total) by mouth Four times a day as needed for Pain    naproxen (NAPROSYN) 500 mg Oral Tablet Take 1 Tablet (500 mg total) by mouth Once per day as needed for Pain    ondansetron  (ZOFRAN) 8 mg Oral Tablet Take 1 Tablet (8 mg total) by mouth Every 8 hours as needed for Nausea/Vomiting Indications: prevent nausea and vomiting from cancer chemotherapy    pantoprazole (PROTONIX) 40 mg Oral Tablet, Delayed Release (E.C.) Take 1 Tablet (40 mg total) by mouth Twice daily    prazosin (MINIPRESS) 2 mg Oral Capsule Take 1 Capsule (2 mg total) by mouth Every night    prochlorperazine (COMPAZINE) 10 mg Oral Tablet Take 1 Tablet (10 mg total) by mouth Every 6 hours as needed for Nausea/Vomiting Indications: prevent nausea and vomiting from cancer chemotherapy    sumatriptan succinate (IMITREX) 100 mg Oral Tablet TAKE 1 TABLET AT ONSET OF HEADACHE MAY REPEAT DOSE IN 2HRS. NO MORE THAN 2/24HRS     Family History:  Family Medical History:       Problem Relation (Age of Onset)    Coronary Artery Disease Mother, Father            Social History:  Social History     Socioeconomic History    Marital status: Married  Occupational History    Occupation: School bus driver   Tobacco Use    Smoking status: Former     Types: Cigarettes     Quit date: 2007     Years since quitting: 16.7    Smokeless tobacco: Never   Substance and Sexual Activity    Alcohol use: Never    Drug use: Never             Review of Systems:  Any pertinent Review of Systems as addressed in the HPI above.    Physical Exam:  Vital Signs:  Vitals:    03/04/22 1647   BP: 115/81   Pulse: (!) 105   Resp: 18   Temp: 37.1 C (98.8 F)   SpO2: 96%   Weight: 94.1 kg (207 lb 6.4 oz)   Height: 1.753 m ('5\' 9"'$ )   BMI: 30.69     Physical Exam  Vitals reviewed.   Constitutional:       General: He is not in acute distress.     Appearance: Normal appearance. He is not ill-appearing, toxic-appearing or diaphoretic.   HENT:      Head: Normocephalic and atraumatic.      Nose: Nose normal.      Mouth/Throat:      Mouth: Mucous membranes are moist.   Eyes:      Extraocular Movements: Extraocular movements intact.      Conjunctiva/sclera: Conjunctivae normal.    Cardiovascular:      Rate and Rhythm: Regular rhythm. Tachycardia present.      Pulses: Normal pulses.      Heart sounds: Normal heart sounds. No murmur heard.  Pulmonary:      Effort: Pulmonary effort is normal. No respiratory distress.      Breath sounds: Normal breath sounds. No wheezing, rhonchi or rales.   Abdominal:      General: Bowel sounds are normal.      Palpations: Abdomen is soft.      Tenderness: There is no abdominal tenderness.   Musculoskeletal:         General: Normal range of motion.      Cervical back: Normal range of motion.      Right lower leg: No edema.      Left lower leg: No edema.   Skin:     General: Skin is warm and dry.      Coloration: Skin is not jaundiced.      Findings: No rash.   Neurological:      General: No focal deficit present.      Mental Status: He is alert and oriented to person, place, and time.      Motor: No weakness.      Gait: Gait normal.   Psychiatric:         Mood and Affect: Mood normal.         Behavior: Behavior normal.         Thought Content: Thought content normal.         Judgment: Judgment normal.        Data Reviewed today:  Labs: from [facility], reviewed on patient's phone today during appointment  Radiology:   Other provider Notes:     Assessment and Plan:  Jason Huffman was seen today for follow up 4 months.    Diagnoses and all orders for this visit:    Difficulty sleeping  -     doxepin (SINEQUAN) 50 mg Oral Capsule; Take 1 Capsule (50 mg total) by  mouth Every night    Other orders  -     prazosin (MINIPRESS) 2 mg Oral Capsule; Take 1 Capsule (2 mg total) by mouth Every night  -     amLODIPine (NORVASC) 5 mg Oral Tablet; Take 2 Tablets (10 mg total) by mouth Once a day         Will start prazosin '2mg'$  QHS.  Patient to decrease amlodipine to '5mg'$  daily as prazosin could also lower his BP.  Patient advised that the tachycardia could be due to some deconditioning as he has not been exercising since he has been taking chemo.  I recommend that the patient start  walking 20-30 min 4-5 days per week.  Beta blocker would likely worsen the nightmares at this point.  So I suggest to add the prazosin and exercise and monitor HR at this time.  Continue follow up with oncology as scheduled.  Patient advised that he will need to seek care with another PCP or chose to follow up at [facility].  Patient can decide and call for appointment for 4 month follow up.  All questions were answered to the satisfaction of the patient while in the room.     Return in about 4 months (around 07/04/2022).    Dan Humphreys, FNP-BC     Portions of this note may be dictated using voice recognition software or a dictation service. Variances in spelling and vocabulary are possible and unintentional. Not all errors are caught/corrected. Please notify the [facility] if any discrepancies are noted or if the meaning of any statement is not clear.

## 2022-03-04 NOTE — Nursing Note (Signed)
03/04/22 1649   Health Education and Literacy   How often do you have a problem understanding what is told to you about your medical condition?  Never   Domestic Violence   Because we are aware of abuse and domestic violence today, we ask all patients: Are you being hurt, hit, or frightened by anyone at your home or in your life?  N   Basic Needs   Do you have any basic needs within your home that are not being met? (such as Food, Shelter, Games developer, Tranportation, paying for bills and/or medications) N   Advanced Directives   Do you have any advanced directives? No Advance   Would you like an advanced directive packet? Accepted Packet

## 2022-08-28 ENCOUNTER — Other Ambulatory Visit: Payer: Self-pay

## 2022-08-28 ENCOUNTER — Emergency Department
Admission: EM | Admit: 2022-08-28 | Discharge: 2022-08-28 | Disposition: A | Discharge status: Home / Self Care | Payer: 59 | Attending: Emergency Medicine | Admitting: Emergency Medicine

## 2022-08-28 ENCOUNTER — Encounter (HOSPITAL_COMMUNITY): Payer: Self-pay

## 2022-08-28 DIAGNOSIS — I2699 Other pulmonary embolism without acute cor pulmonale: Secondary | ICD-10-CM | POA: Insufficient documentation

## 2022-08-28 DIAGNOSIS — Z8571 Personal history of Hodgkin lymphoma: Secondary | ICD-10-CM | POA: Insufficient documentation

## 2022-08-28 DIAGNOSIS — Z9221 Personal history of antineoplastic chemotherapy: Secondary | ICD-10-CM | POA: Insufficient documentation

## 2022-08-28 MED ORDER — APIXABAN 5 MG TABLET
ORAL_TABLET | ORAL | 0 refills | Status: AC
Start: 2022-08-28 — End: 2022-09-27

## 2022-08-28 NOTE — Discharge Instructions (Signed)
Follow up with your oncology clinic next week. Start Eliquis, stop any motrin, Advil, aleve or aspirin.

## 2022-08-28 NOTE — ED Nurses Note (Signed)
Pt DC home , pt left department via ambulation without difficulties.

## 2022-08-28 NOTE — ED Triage Notes (Addendum)
Had PET scan yesterday at [facility] in [place], was called today told has PE and to come to ER. H./o lymphoma with tumor around heart.

## 2022-08-28 NOTE — ED Provider Notes (Signed)
Emergency Medicine    Name: Jason Huffman  Age and Gender: 47 y.o. male  Date of Birth: March 15, 1976  MRN: [id number]  PCP: Elliot Gault, DO    CC:  Chief Complaint   Patient presents with    Abnormal Radiology Test       HPI:  Jason Huffman is a 47 y.o. White male with history of dull, aching chest pain and shortness of breath for two months.  He has a history of a 'tumor around his heart,' resected and chemotherapy...finished chemo in August.    PE last year. Stopped Eliquis in January.      PET done yesterday at [facility], [facility].    No fever, no cough, no leg pain or swelling.    Contacted by Farrel Gordon, PA at cancer center and advised to come to ER.      [place] Pain Rating Scale     On a scale of 0-10, during the past 24 hours, pain has interfered with you usual activity:       On a scale of 0-10, during the past 24 hours, pain has interfered with your sleep:      On a scale of 0-10, during the past 24 hours, pain has affected your mood:       On a scale of 0-10, during the past 24 hours, pain has contributed to your stress:       On a scale of 0-10, what is your overall pain Rating: 5        Below pertinent information reviewed with patient:  Past Medical History:   Diagnosis Date    Hodgkin lymphoma (CMS [place])            No Known Allergies    Past Surgical History:   Procedure Laterality Date    FINGER SURGERY Right     HX LAP CHOLECYSTECTOMY      MEDIPORT INSERTION, SINGLE Right            Social History        Objective:    ED Triage Vitals [08/28/22 0954]   BP (Non-Invasive) (!) 128/95   Heart Rate 86   Respiratory Rate 18   Temperature 36.6 C (97.8 F)   SpO2 99 %   Weight 93 kg (205 lb)   Height 1.753 m (5\' 9" )     Filed Vitals:    08/28/22 0954 08/28/22 1000 08/28/22 1015   BP: (!) 128/95 (!) 140/77 128/80   Pulse: 86  76   Resp: 18  17   Temp: 36.6 C (97.8 F)     SpO2: 99%  96%       Nursing notes and vital signs reviewed.    Constitutional - No acute distress.  Alert and  Active.  HEENT - Normocephalic. Atraumatic. PERRL. EOMI. Conjunctiva clear. Oropharynx with no erythema, lesions, or exudates. Moist mucous membranes.   Neck - Trachea midline. No stridor. No hoarseness.  Cardiac - Regular rate and rhythm. No murmurs, rubs, or gallops.  Respiratory - Clear to auscultation bilaterally. No rales, wheezes or rhonchi.  Musculoskeletal - Good AROM. No muscle or joint tenderness appreciated. No clubbing, cyanosis or edema.  Skin - Warm and dry, without any rashes or other lesions.    Any pertinent labs and imaging obtained during this encounter reviewed below in MDM.    MDM/ED Course:    EKG shows a sinus rhythm, rate of 66. Normal PR and QRS. No STEMI.  I discussed the patient with Mr. Wayne Both, his PA. He tells me that the CT showed low attenuation, small, nonocclusive PEs.    He is in no distress without hypoxemia.    I am restarting Eliquis and he is to follow up with the oncology clinic next week as planned. This plan was discussed with his PA, Apolinar Junes, who will see him in follow up.    Medical Decision Making  Amount and/or Complexity of Data Reviewed  ECG/medicine tests: ordered. Decision-making details documented in ED Course.    Risk  Prescription drug management.  Decision regarding hospitalization.        Impression:   Clinical Impression   Pulmonary embolism, unspecified chronicity, unspecified pulmonary embolism type, unspecified whether acute cor pulmonale present (CMS HCC) (Primary)       Disposition: Discharged          Portions of this note may have been dictated using voice recognition software.     Cruzita Lederer, [facility] ED    -----------------------  No results found for this or any previous visit (from the past 12 hour(s)).  No orders to display

## 2022-08-29 DIAGNOSIS — R079 Chest pain, unspecified: Secondary | ICD-10-CM

## 2022-08-29 LAB — ECG 12 LEAD
Atrial Rate: 66 {beats}/min
Calculated P Axis: 27 degrees
Calculated R Axis: 59 degrees
Calculated T Axis: 38 degrees
PR Interval: 148 ms
QRS Duration: 80 ms
QT Interval: 378 ms
QTC Calculation: 396 ms
Ventricular rate: 66 {beats}/min

## 2022-09-18 IMAGING — MR MRI CERVICAL SPINE WITHOUT CONTRAST
4 of 5 series · 23 of 48 positions shown · IV contrast (gadolinium)
Comparison: C-spine x-ray dated 09/10/2022.

﻿EXAM:  [id number]   MRI CERVICAL SPINE WITHOUT CONTRAST
INDICATION: Neck pain, left shoulder pain with weakness in the left arm.
TECHNIQUE: Multiplanar, multisequential MRI of the C-spine was performed without gadolinium contrast.

[Series 5: T2 · sagittal · 3.0mm · 0.75mm/px · 8 of 13 slices shown (1 of 2)]
[im 1/13]
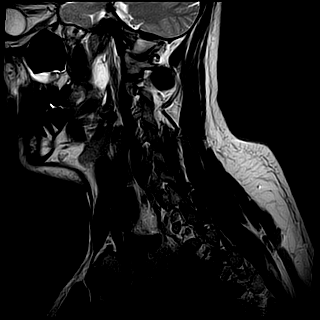
[im 2/13]
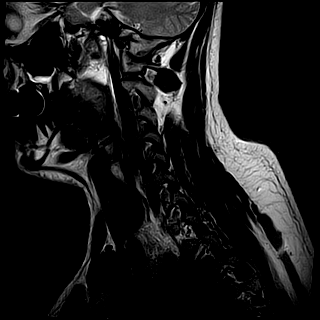
[im 4/13]
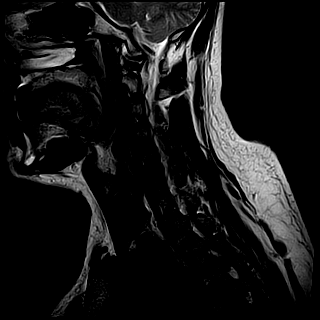
[im 6/13]
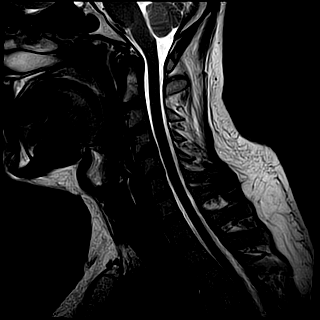
[im 7/13]
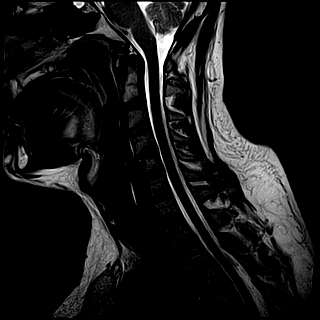
[im 9/13]
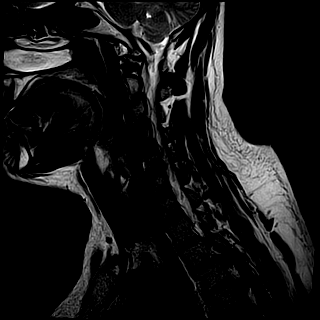
[im 11/13]
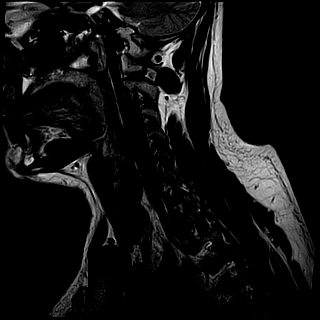
[im 13/13]
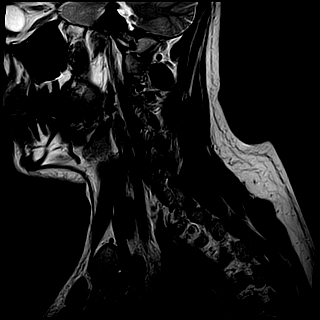

[Series 6: T1 · sagittal · 3.0mm · 0.47mm/px · 3 of 13 slices shown]
[im 2/13]
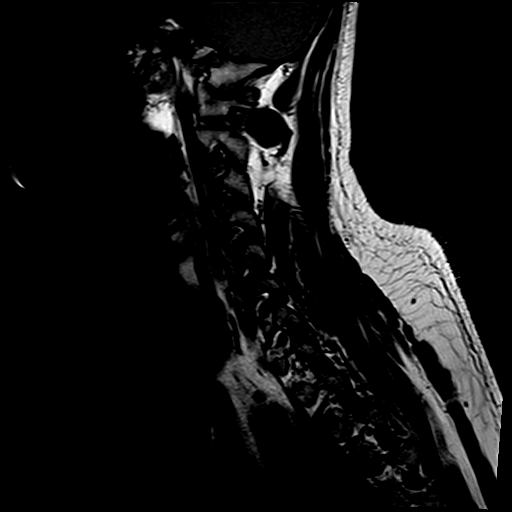
[im 7/13]
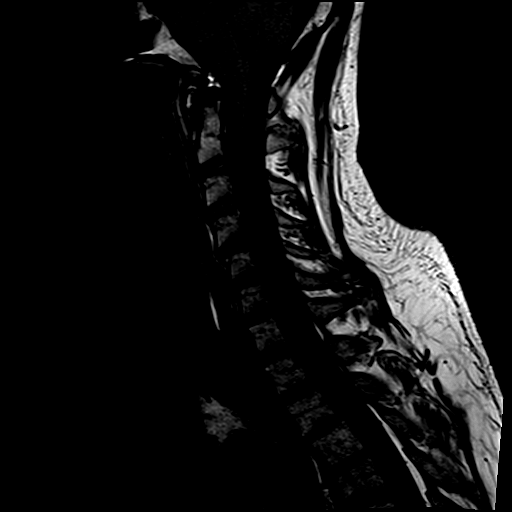
[im 11/13]
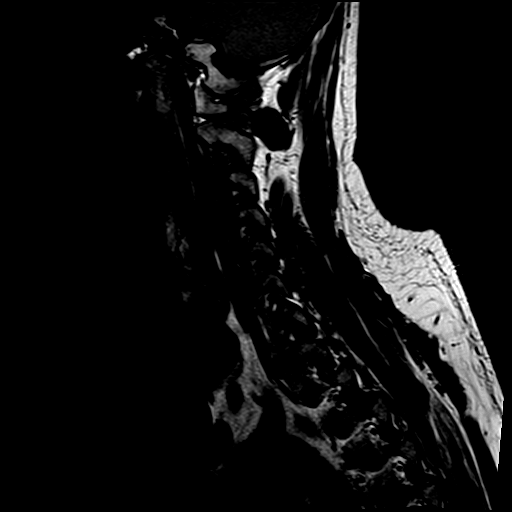

[Series 7: STIR · sagittal · 3.0mm · 0.47mm/px · 3 of 13 slices shown]
[im 2/13]
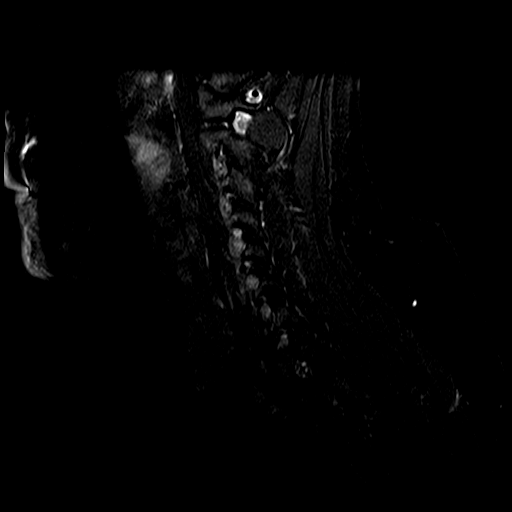
[im 7/13]
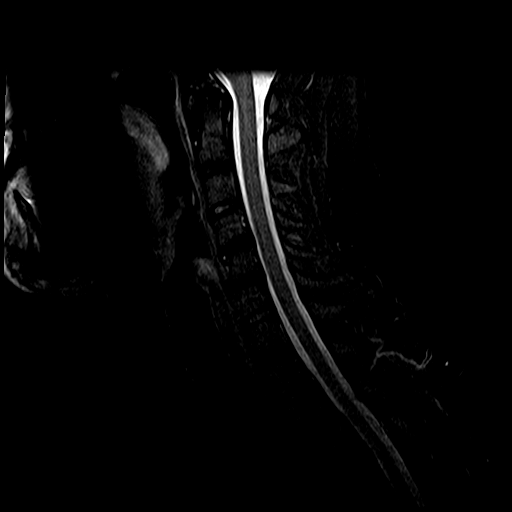
[im 11/13]
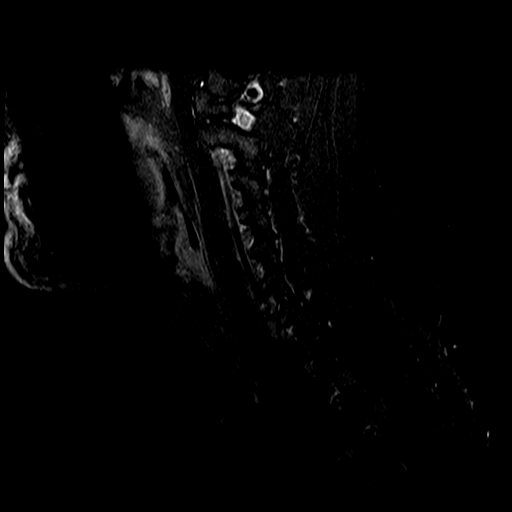

[Series 8: T2 · axial · 3.0mm · 0.39mm/px · z∈[-100,+5]mm · 9 of 18 slices shown (2 of 2)]
[im 1/18]
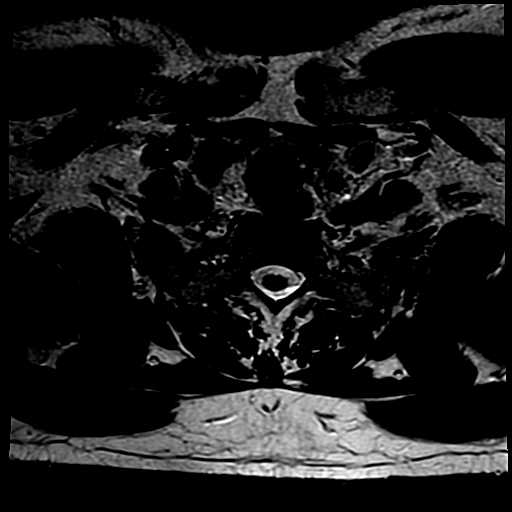
[im 4/18]
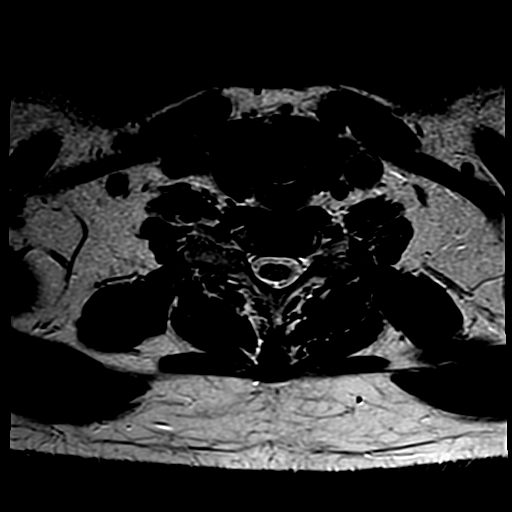
[im 5/18]
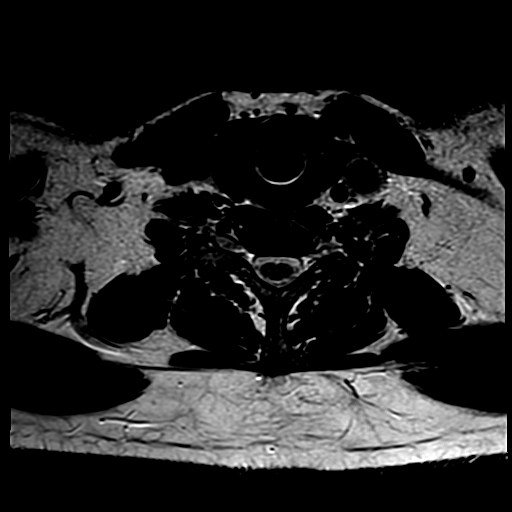
[im 8/18]
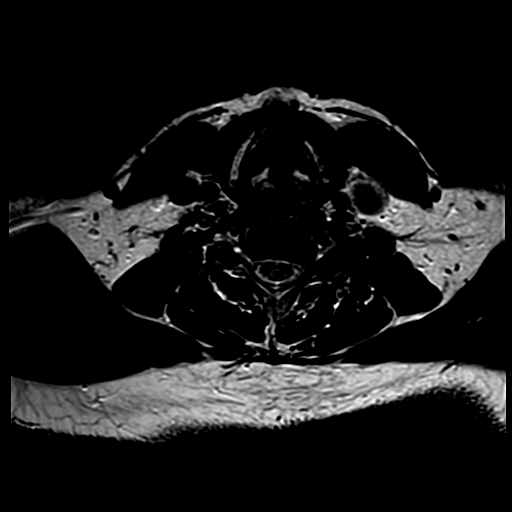
[im 10/18]
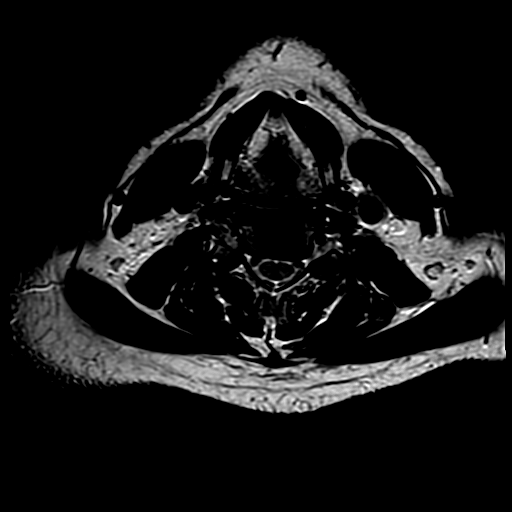
[im 13/18]
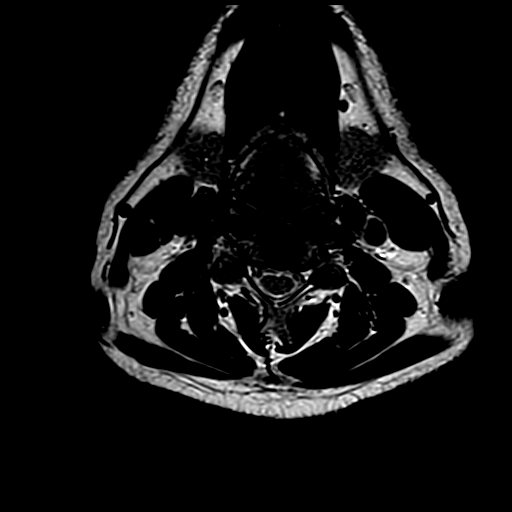
[im 14/18]
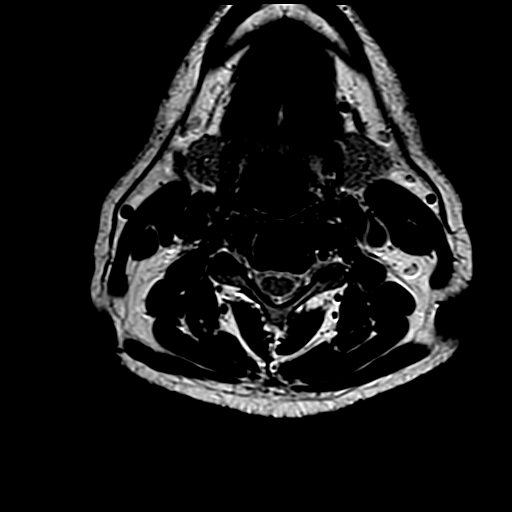
[im 16/18]
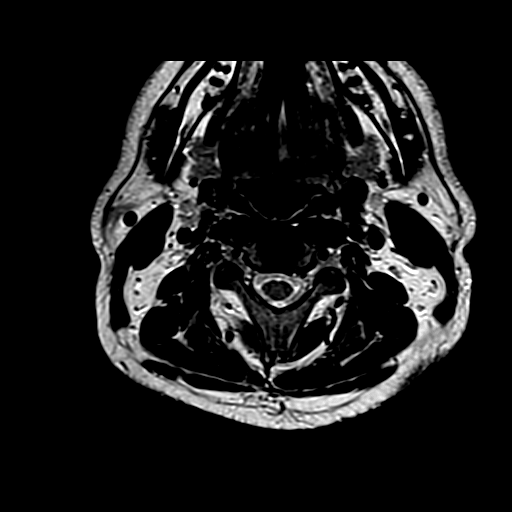
[im 18/18]
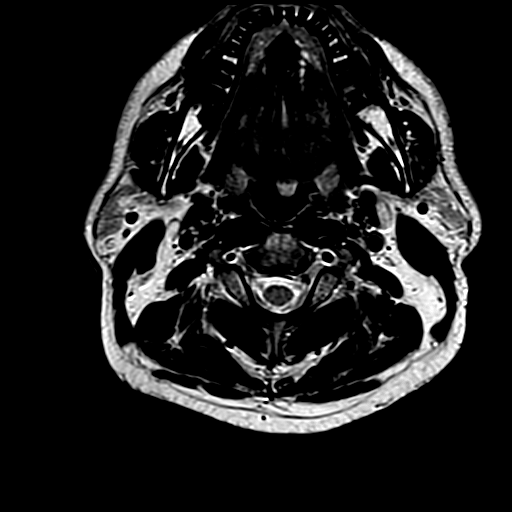

[23 of 48 positions shown; findings below may reference images not displayed]

FINDINGS: No acute bony lesions of cervical vertebrae are seen.

Foramen magnum structures are normal in the sagittal projection. 

At C2-3 level, no focal disc lesions are seen. 

At C3-4 level, no focal disc lesions are seen. 

At C4-5 level, degenerative disc disease of moderate degree with bulging annulus causing moderate compromise of both neural foramina.  Minimal compromise of thecal sac in the midline with AP diameter measuring 10.7 mm. 

At C5-6 level, bulging annulus due to degenerative changes causing moderate compromise of right neural foramen and minimal compromise of left neural foramen.

C6-7 and C7-T1 discs are normal. 

Cervical spinal cord shows no focal lesions. Paravertebral soft tissues are unremarkable.
IMPRESSION: 1. No acute or focal bone changes of cervical vertebrae. 

2. At C4-5 level, degenerative disc disease of moderate degree with bulging annulus causing moderate compromise of both neural foramina.  Minimal compromise of thecal sac in the midline with AP diameter measuring 10.7 mm. 

3. At C5-6 level, bulging annulus due to degenerative changes causing moderate compromise of right neural foramen and minimal compromise of left neural foramen.

4. Findings at other disc levels are described above.  No focal lesions of cervical spinal cord are seen.

Electronically Signed by SERVIN, ASTOU at [id number] [DATE]

## 2022-10-02 IMAGING — MR MRI THORACIC SPINE WITHOUT CONTRAST
4 of 5 series · 26 of 48 positions shown · non-contrast
Comparison: None available.

﻿EXAM:  [id number]   MRI THORACIC SPINE WITHOUT CONTRAST
INDICATION: Left-sided pain.
TECHNIQUE: Noncontrast multiplanar, multisequence MRI was performed.

[Series 5: T2 · sagittal · 4.0mm · 0.78mm/px · 8 of 13 slices shown (1 of 2)]
[im 1/13]
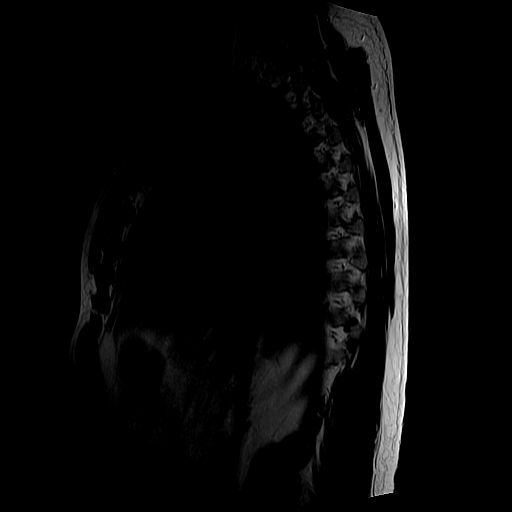
[im 2/13]
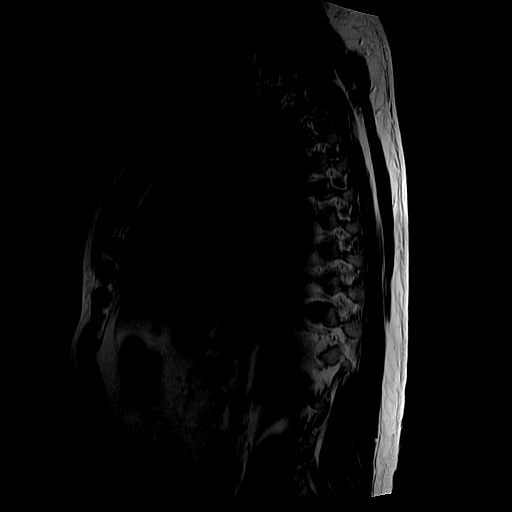
[im 5/13]
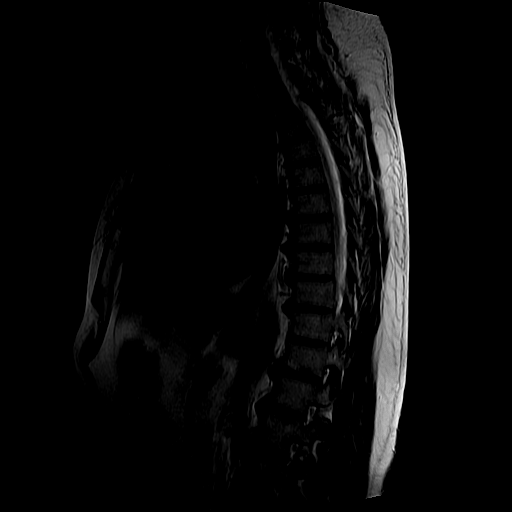
[im 6/13]
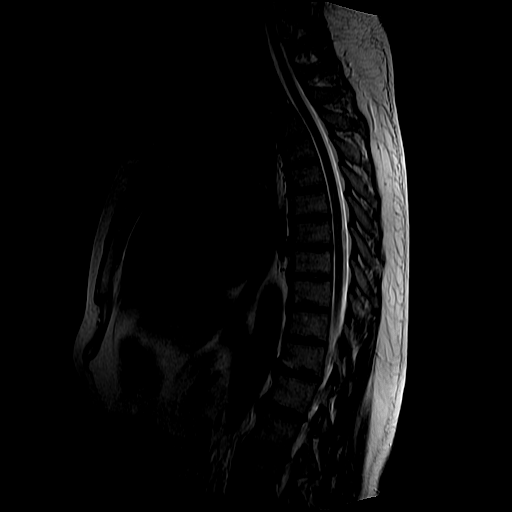
[im 7/13]
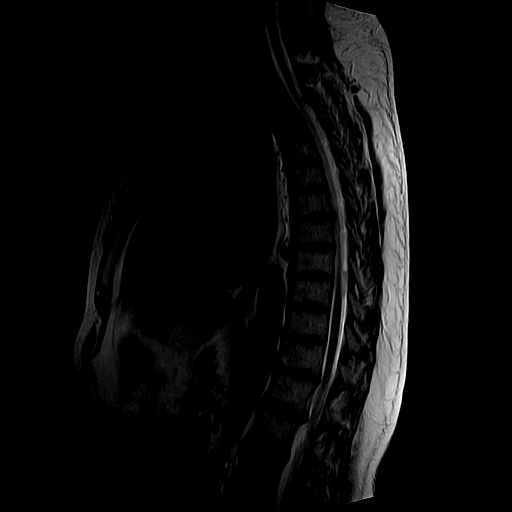
[im 9/13]
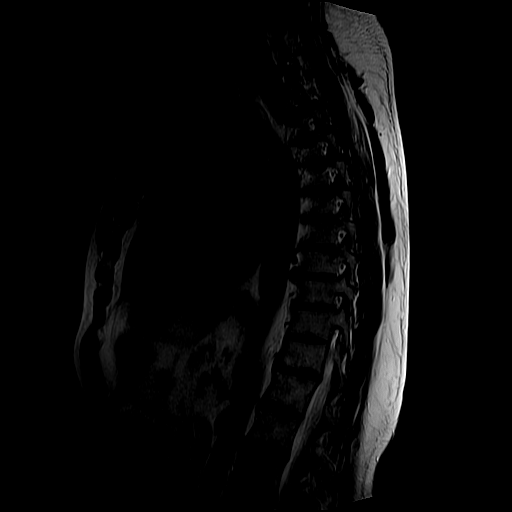
[im 11/13]
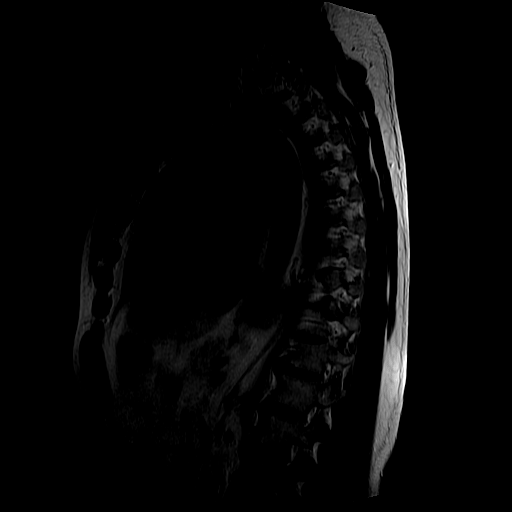
[im 13/13]
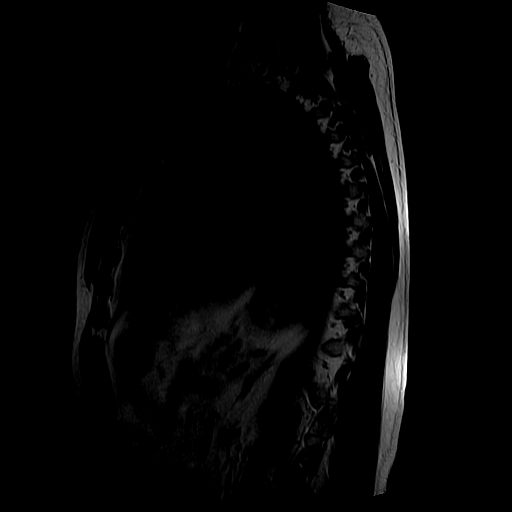

[Series 6: T1 · sagittal · 4.0mm · 0.78mm/px · 6 of 13 slices shown (1 of 2)]
[im 1/13]
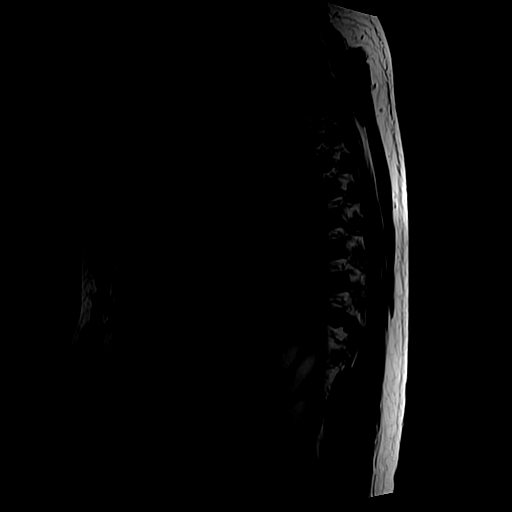
[im 2/13]
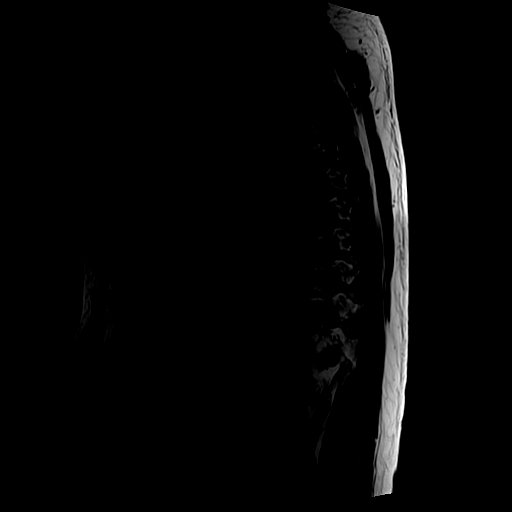
[im 5/13]
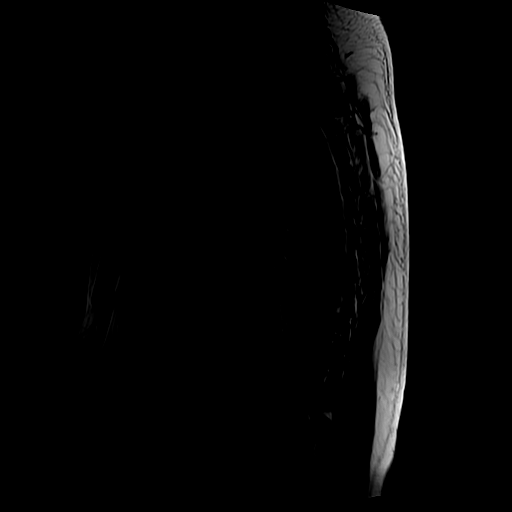
[im 6/13]
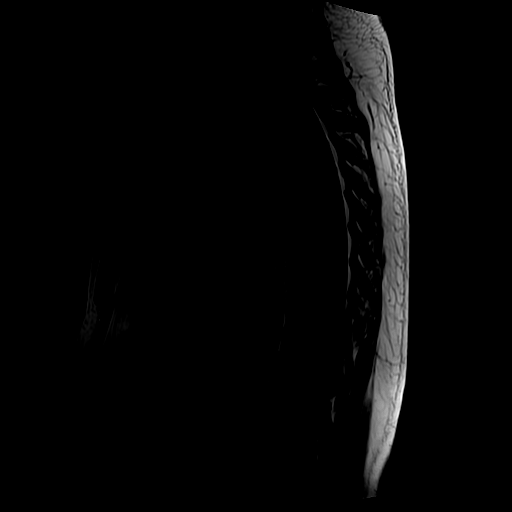
[im 7/13]
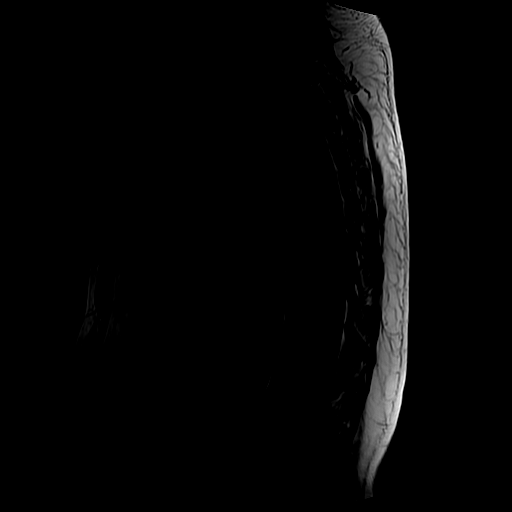
[im 11/13]
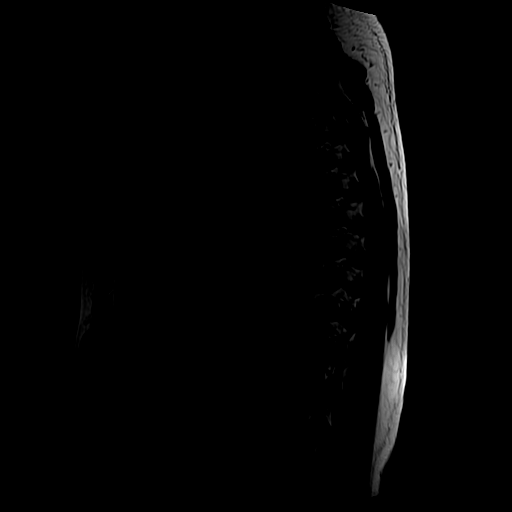

[Series 10: T2 · axial · 4.0mm · 0.62mm/px · z∈[-315,-119]mm · 9 of 12 slices shown (2 of 2)]
[im 1/12]
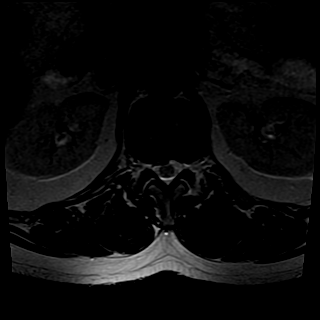
[im 2/12]
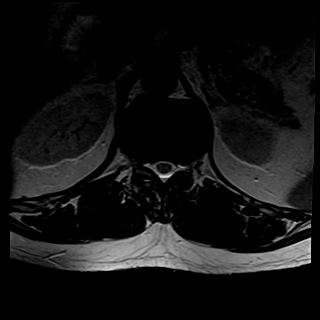
[im 3/12]
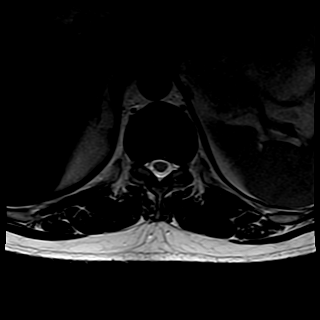
[im 5/12]
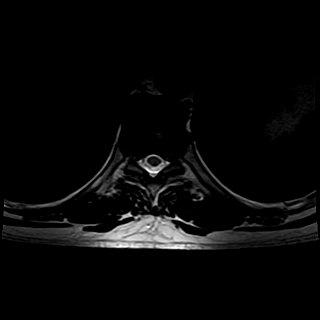
[im 6/12]
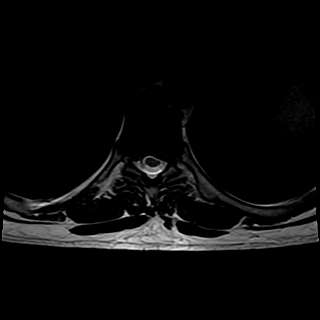
[im 7/12]
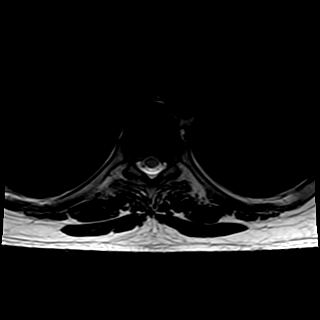
[im 9/12]
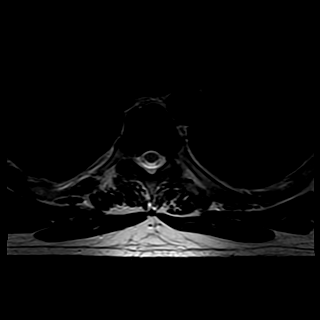
[im 10/12]
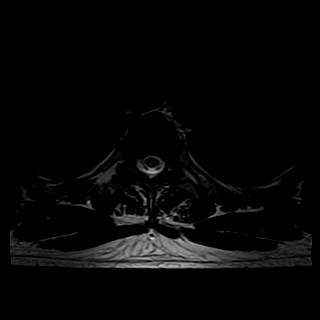
[im 12/12]
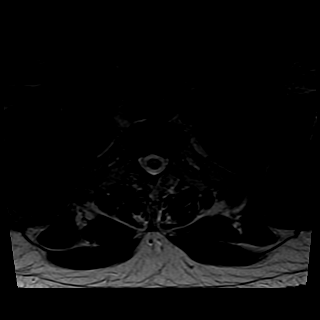

[Series 11: T1 · axial · 4.0mm · 0.62mm/px · z∈[-303,-150]mm · 3 of 12 slices shown (2 of 2)]
[im 2/12]
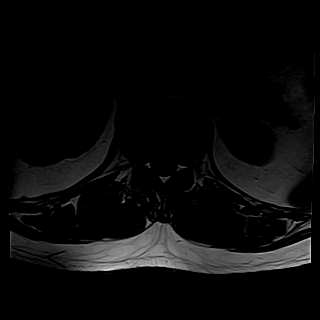
[im 6/12]
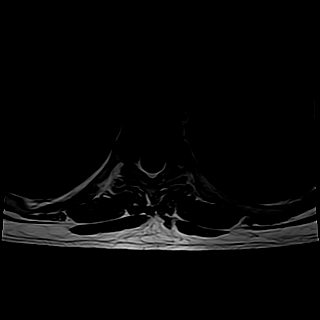
[im 10/12]
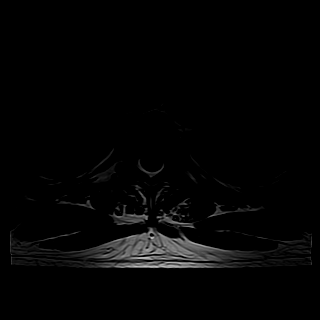

[26 of 48 positions shown; findings below may reference images not displayed]

FINDINGS: There are mild degenerative changes at multiple levels.  

At T12-L1, there is a small right paracentral disc herniation with compression of the right side of the thecal sac but no apparent spinal cord or nerve root compression.  

No other disc herniation is seen.  There is no fracture, malalignment, significant marrow signal alteration, spinal stenosis, or abnormality of the spinal cord.
IMPRESSION: Small T12-L1 disc herniation.

## 2022-12-30 IMAGING — DX XRAY LUMBAR SPINE MINIMUM 4 VIEWS
1 series · 5 of 5 positions shown · non-contrast
Comparison: None available.

﻿EXAM:  [id number]      XRAY LUMBAR SPINE MINIMUM 4 VIEWS
INDICATION: Low back pain.

[Series 1: AP · 0.14mm/px · 5 of 5 slices shown]
[im 1/5]
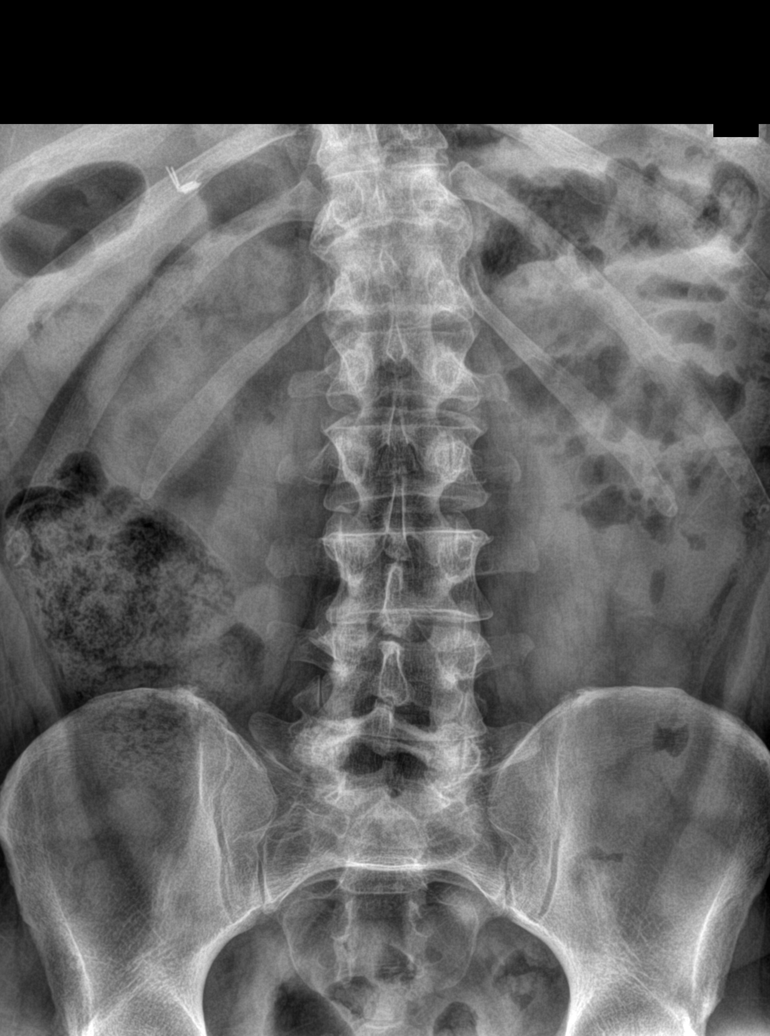
[im 2/5]
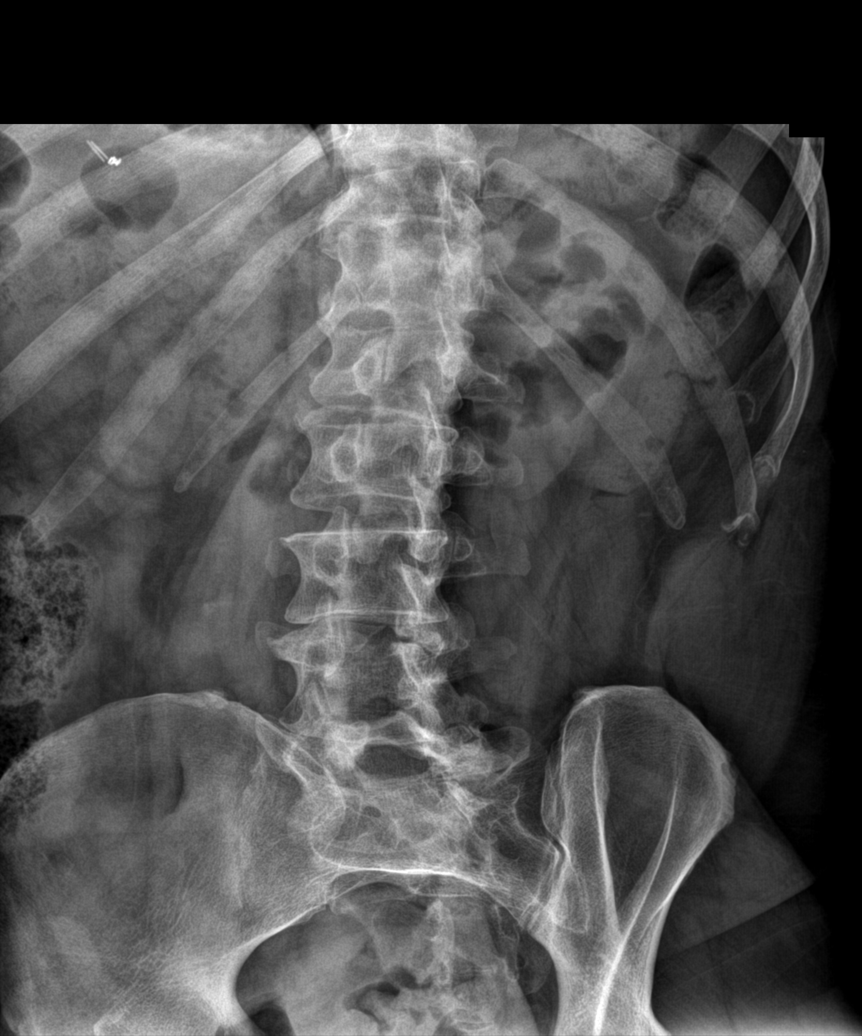
[im 3/5]
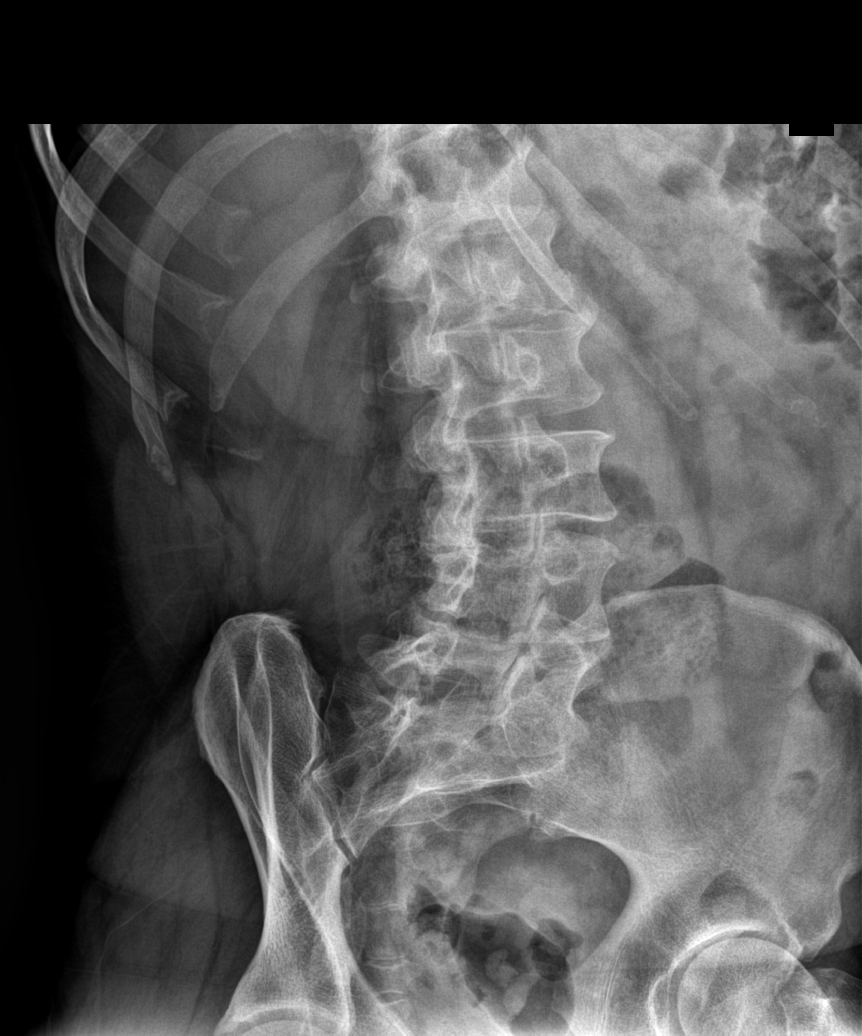
[im 4/5]
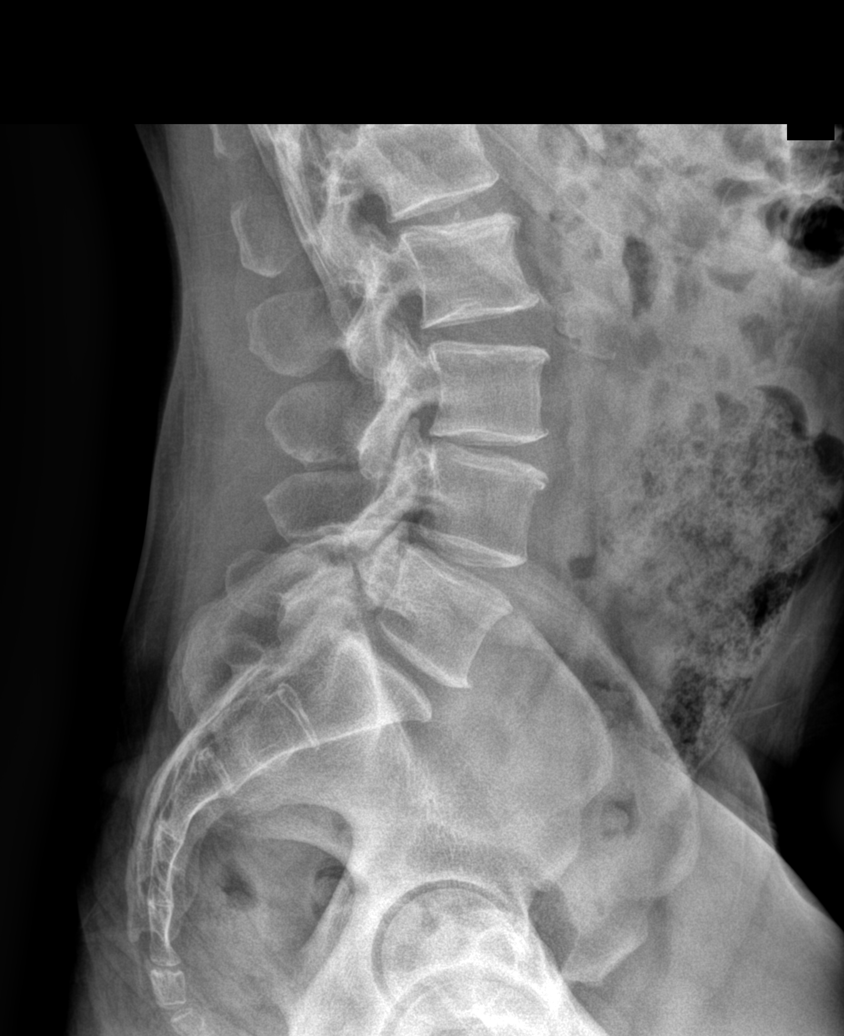
[im 5/5]
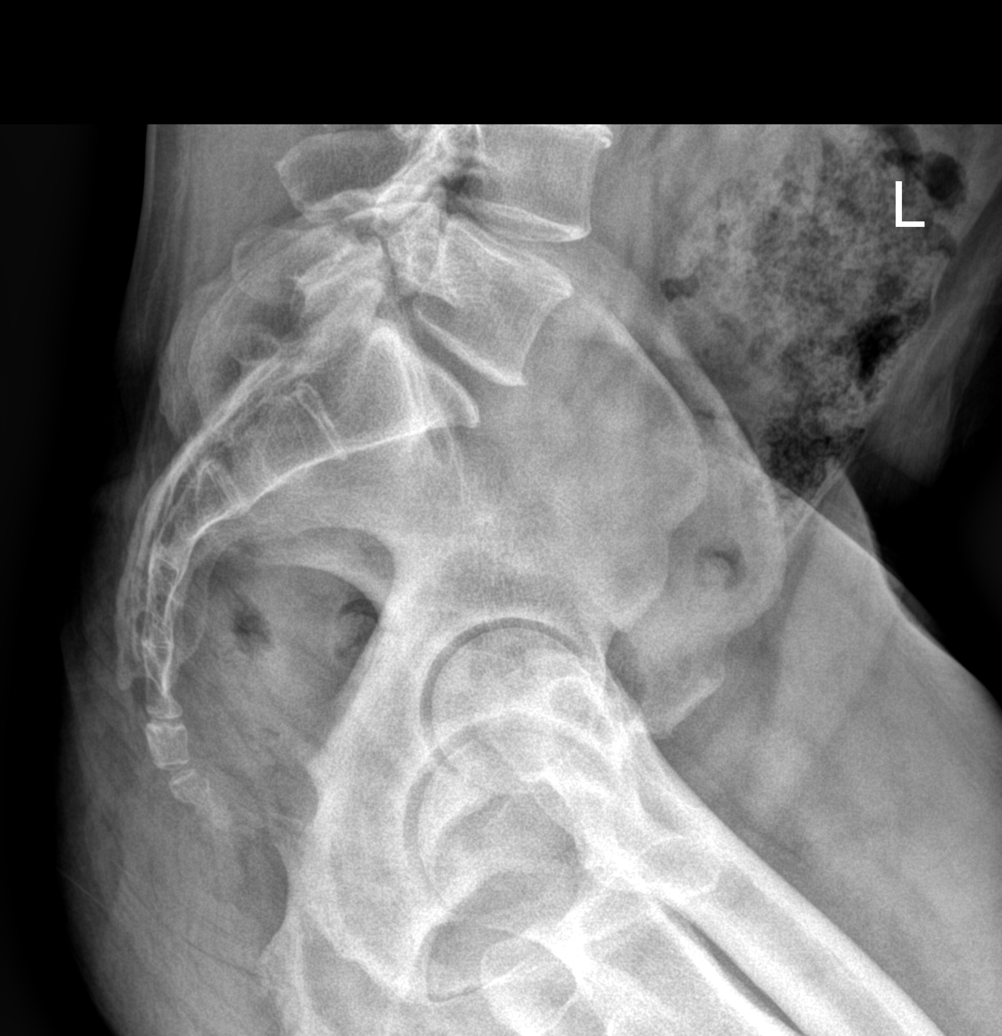

[5 of 5 positions shown; findings below may reference images not displayed]

FINDINGS: No acute bony lesions of lumbar vertebrae.  Significant degenerative disc changes of L3-4 and L5-S1 discs.  Evidence of pars defect of L5 with minimal anterolisthesis.  Significant degenerative disc changes in lower thoracic spine.
IMPRESSION: Significant multilevel degenerative disc disease.  Pars defect at L5 with minimal spondylolisthesis.

## 2023-01-13 ENCOUNTER — Ambulatory Visit: Payer: 59

## 2023-01-13 IMAGING — MR MRI LUMBAR SPINE WITHOUT CONTRAST
4 of 6 series · 31 of 48 positions shown · IV contrast (gadolinium)
Comparison: Lumbar spine x-ray dated 12/30/2022.

﻿EXAM:  [id number]   MRI LUMBAR SPINE WITHOUT CONTRAST
INDICATION: Low back pain with radicular symptoms to right lower extremity.  No history of back surgery.  History of non-Hodgkin's lymphoma in the chest.
TECHNIQUE: Multiplanar multisequential MRI of the lumbosacral spine was performed without gadolinium contrast.

[Series 5: T2 · sagittal · 4.5mm · 0.94mm/px · 7 of 13 slices shown (1 of 3)]
[im 1/13]
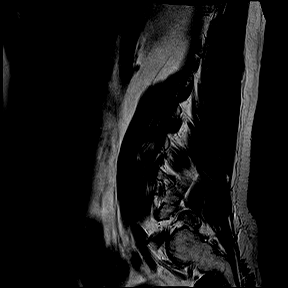
[im 3/13]
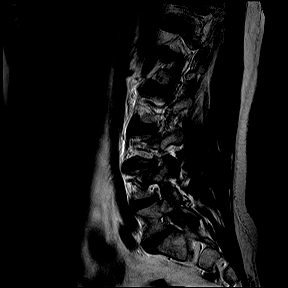
[im 5/13]
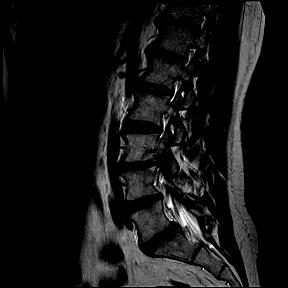
[im 7/13]
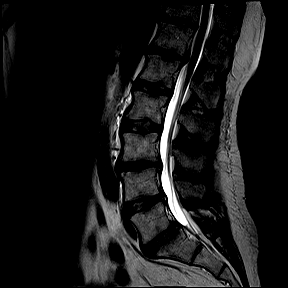
[im 9/13]
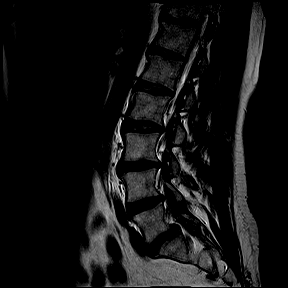
[im 11/13]
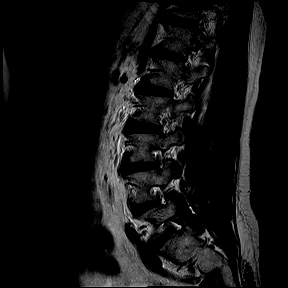
[im 13/13]
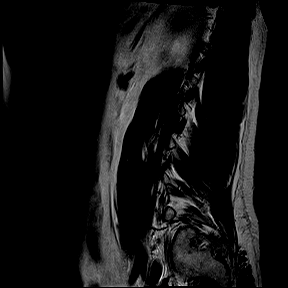

[Series 6: T1 · sagittal · 4.5mm · 0.94mm/px · 6 of 13 slices shown]
[im 1/13]
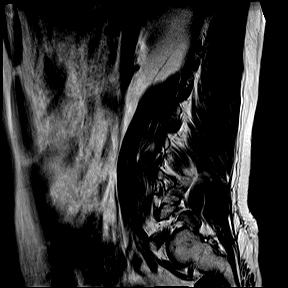
[im 3/13]
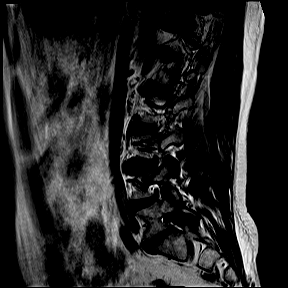
[im 5/13]
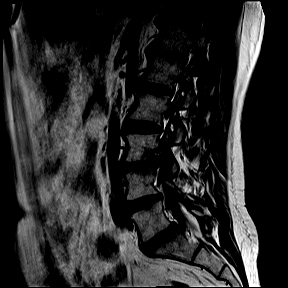
[im 7/13]
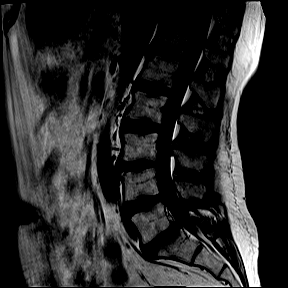
[im 9/13]
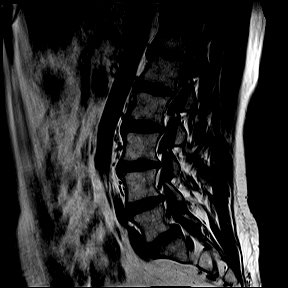
[im 11/13]
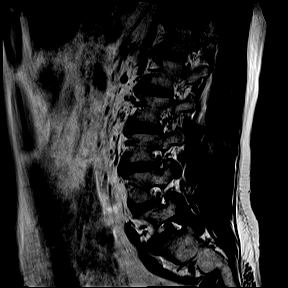

[Series 8: T2 · coronal · 5.0mm · 0.82mm/px · 9 of 18 slices shown (2 of 3)]
[im 1/18]
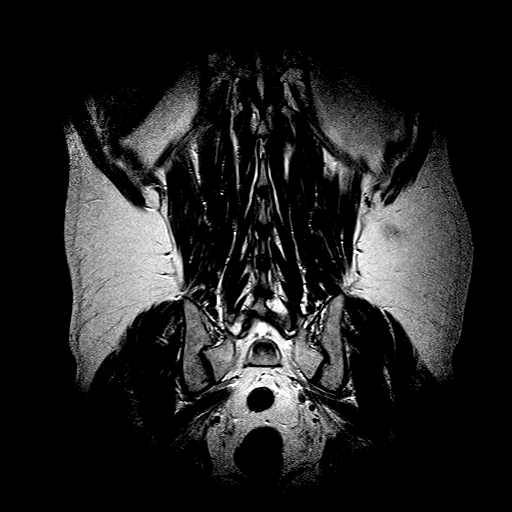
[im 3/18]
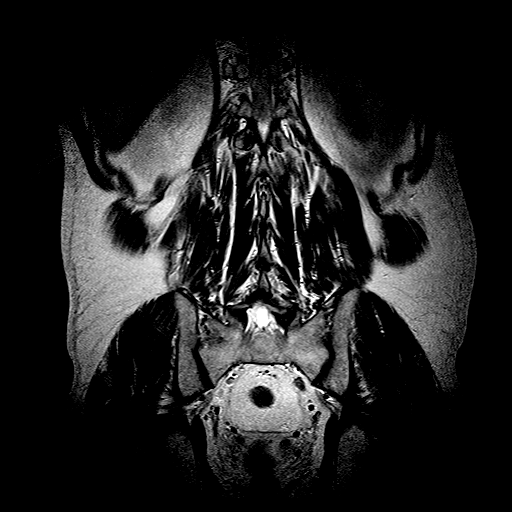
[im 5/18]
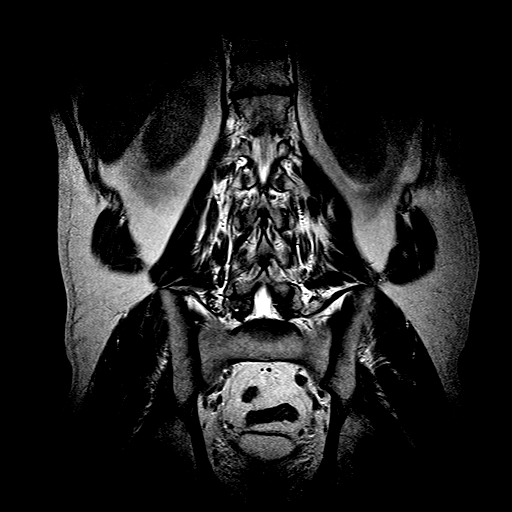
[im 7/18]
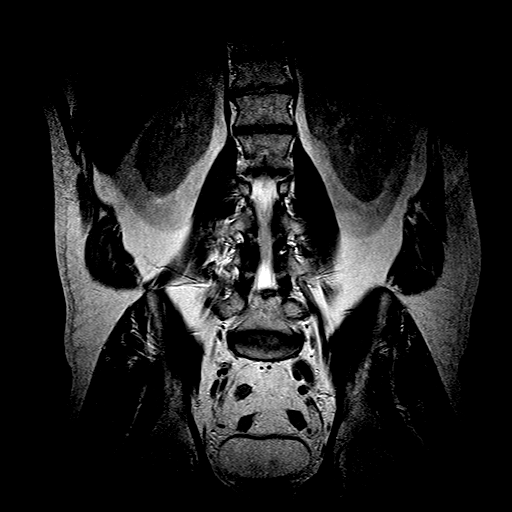
[im 9/18]
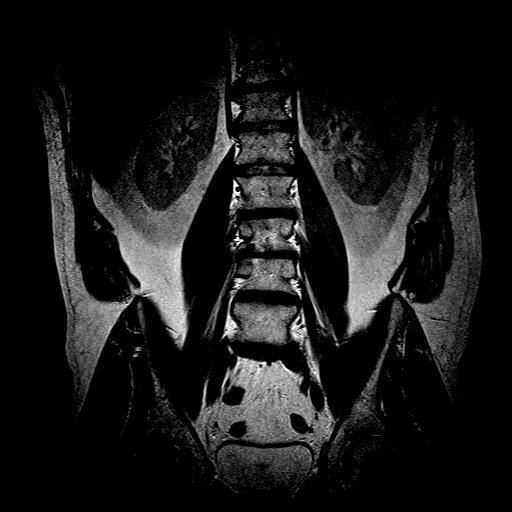
[im 11/18]
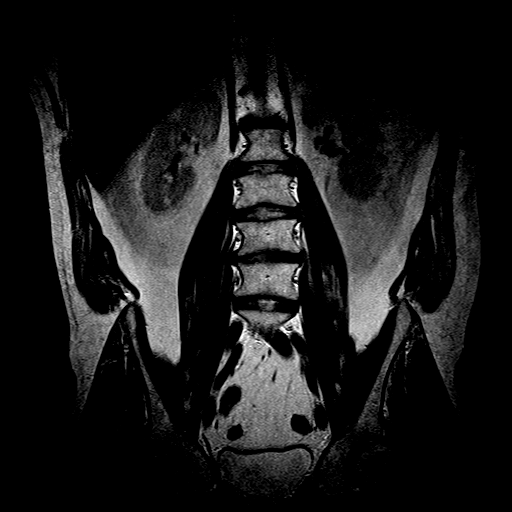
[im 13/18]
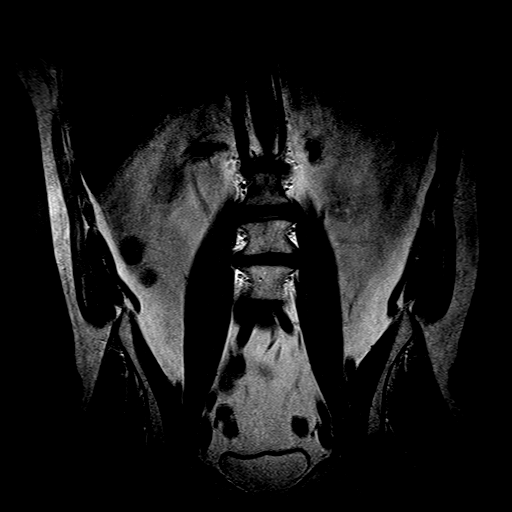
[im 15/18]
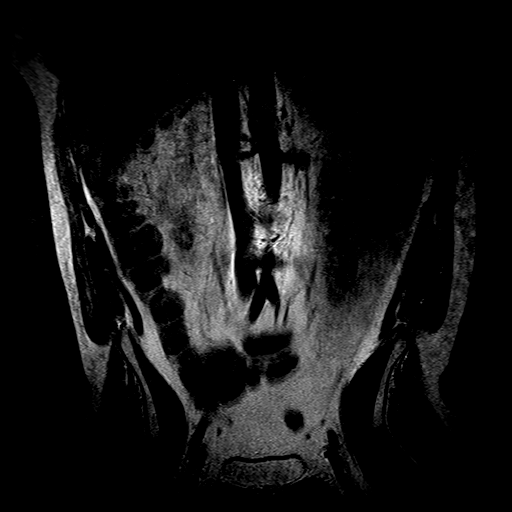
[im 18/18]
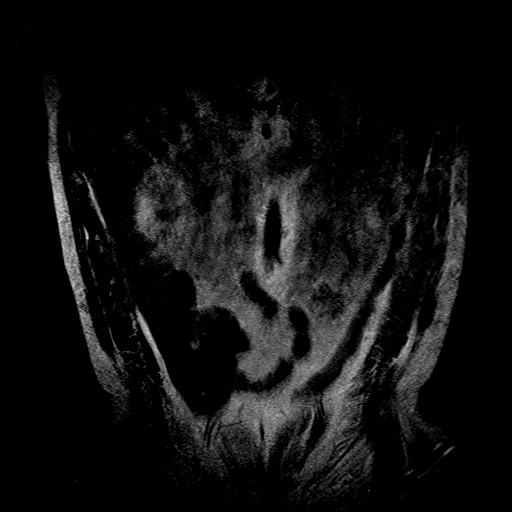

[Series 9: T2 · oblique · 4.0mm · 0.52mm/px · 9 of 17 slices shown (3 of 3)]
[im 1/17]
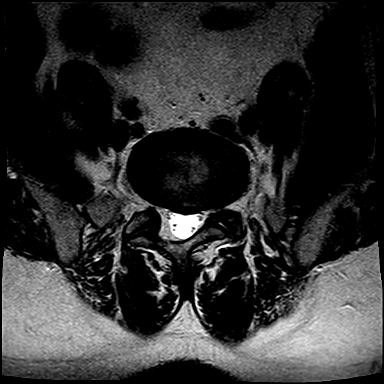
[im 3/17]
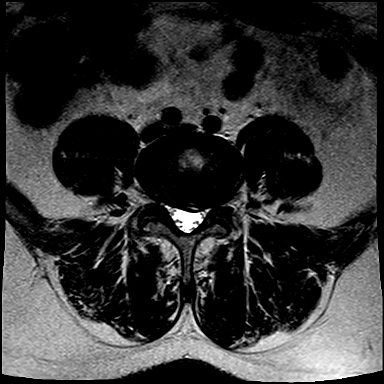
[im 5/17]
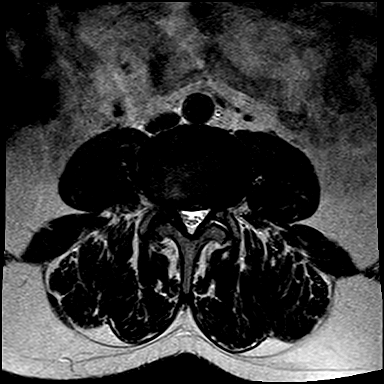
[im 7/17]
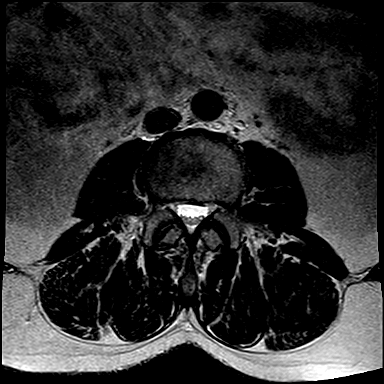
[im 9/17]
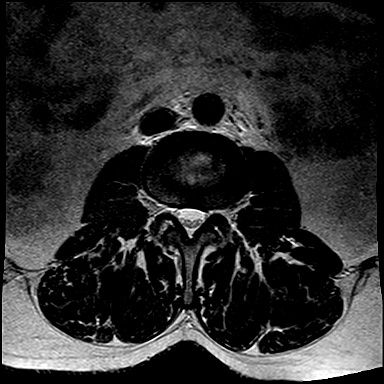
[im 11/17]
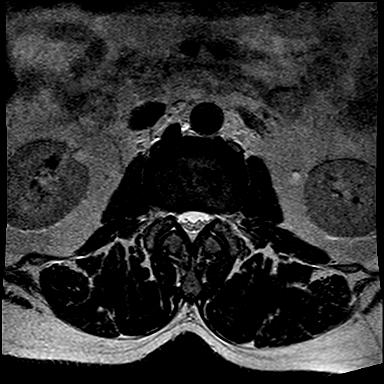
[im 13/17]
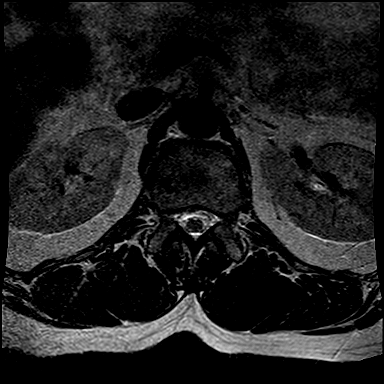
[im 15/17]
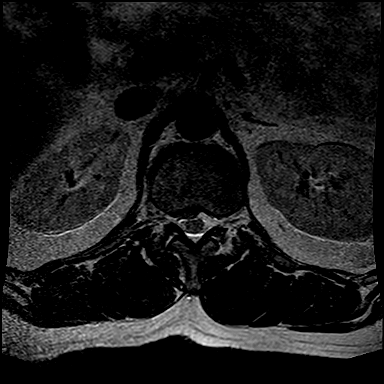
[im 17/17]
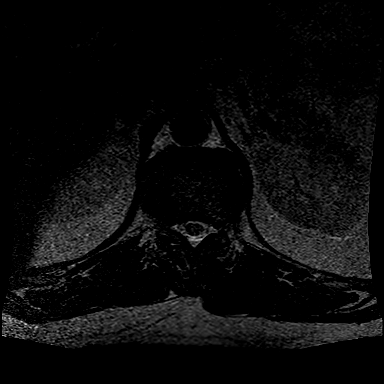

[31 of 48 positions shown; findings below may reference images not displayed]

FINDINGS: No focal bone changes of lumbar vertebrae. Conus medullaris and cauda equina structures are normal in the sagittal projection. 

At T12-L1 level, broad-based disc protrusion on the right side is causing moderately significant compromise of right lateral recess and right neural foramina. 

At L1-2 level, bilateral facet arthropathy is causing mild to moderate compromise of both lateral recess.

At L2-3 level, bilateral facet arthropathy is causing mild to moderate compromise of both lateral recess. Mild compromise of thecal sac with AP diameter in the midline measuring 9.5 mm. 

At L3-4 level, significant degenerative disc disease and bilateral facet arthropathy are causing severe compromise of both neural foramina, right worse than the left including lateral recess.  Mild compromise of thecal sac in the midline measuring 6.4 mm.

At L4-5 level, degenerative disc disease with bulging annulus is noted along with annulus tear to the left of the midline.  Disc changes and facet arthropathy are causing significant compromise of left lateral recess and neural foramina and moderate compromise of right neural foramina.

At L5-S1 level, evidence of pars defect of L5 is noted with minimal anterolisthesis of L5 on S1.  Degenerative disc disease and facet arthropathy are causing severe left foraminal narrowing and moderate right foraminal narrowing.

Paravertebral soft tissues do not show any focal abnormalities.
IMPRESSION: 1.  No acute or focal bone changes of lumbar vertebrae. 

2. Multilevel degenerative changes are noted and described at each level above separately.

3. Paravertebral soft tissues are unremarkable

## 2023-01-26 ENCOUNTER — Ambulatory Visit
Admission: RE | Admit: 2023-01-26 | Discharge: 2023-01-26 | Disposition: A | Discharge status: Still In Hospital | Payer: 59 | Source: Ambulatory Visit | Attending: Family | Admitting: Family

## 2023-01-26 ENCOUNTER — Encounter (INDEPENDENT_AMBULATORY_CARE_PROVIDER_SITE_OTHER): Payer: Self-pay

## 2023-01-26 ENCOUNTER — Other Ambulatory Visit (HOSPITAL_COMMUNITY): Payer: Self-pay | Admitting: Family

## 2023-01-26 ENCOUNTER — Other Ambulatory Visit: Payer: Self-pay

## 2023-01-26 ENCOUNTER — Encounter (HOSPITAL_COMMUNITY): Payer: Self-pay

## 2023-01-26 DIAGNOSIS — M5136 Other intervertebral disc degeneration, lumbar region: Secondary | ICD-10-CM | POA: Insufficient documentation

## 2023-01-26 DIAGNOSIS — M4310 Spondylolisthesis, site unspecified: Secondary | ICD-10-CM | POA: Insufficient documentation

## 2023-01-26 DIAGNOSIS — M545 Low back pain, unspecified: Secondary | ICD-10-CM

## 2023-01-26 NOTE — PT Evaluation (Signed)
[facility]  Outpatient Physical Therapy  [address]  [place], [address]  [phone]  (Fax) [phone]      Physical Therapy Lumbar Evaluation    Date: 01/26/2023  Patient's Name: Jason Huffman  Date of Birth: April 05, 1976  Physical Therapy Evaluation      PT diagnosis: spondylolisthesis and lumbar DDD  Reason for Referral: Pt presents with acute LBP. He woke up 1 mo ago with severe LBP which radiates to R knee as numbness.             SUBJECTIVE  Date of onset: 1 month ago    Mechanism of injury: none noted    PMH: Hodgkin's lymphoma    Previous episodes/treatments: none    PLOF: IND    Medications for this problem: muscle relaxer (has not helped)    Diagnostic tests: L5 pars defect, DDD    Patient goals: REDUCE PAIN and NORMALIZE FUNCTION    Occupation:  drives bus and works at [employer]    Next MD visit: September 23    Pain location: R side low back                    Pain description: SHARP    Pain frequency:  CONTINUOUS    Pain rating: Now 2   Best 2   Worst 10    Radiculopathy: to R knee L4-L5 dermatome    Pain increases with: POSITION CHANGE, ADLs, ACTIVITY, EXERCISE, STAND, SIT, STAIR CLIMBING, BENDING, and LIFTING           decreases with : SUPINE POSITION and REST    Sensation: intact to LT B LE    Weakness: None noted    Sleep affected: pt does not sleep well and does wake due to pain with changing positions    Bowel/bladder problems: none noted    Subjective Functional Reports:    Sitting: LIMITED    Standing: LIMITED    Walking: LIMITED    Lifting: LIMITED          OBJECTIVE    Patient-Specific Functional Score:    Problem Score   1. Getting in and out of chairs 2   2. Walking up and down the stairs on the bus 2   3. Walking 1 mi 0   Total 1.3       AROM   right left   Flexion Ankles * upon return to neutral    Extension 0 deg *    Sidebend GT * GT *   Rotation 20 deg * 20 deg *     ROM comments Tightness noted in B HS, Psoas, quads    Strength     right  left   Hip flexion (L1,2) 4+ * 4+   Hip extension (L5,S1,2) 4+ 4+   Knee flexion (S1) 4+ 4+   Knee extension (L2-4) 4+ 4+   Ankle DF (L4-5) 4+ 4+   Ankle PF (S1,2) 4+ 4+     Strength comments pain and audible pop with active R hip flexion    Palpation: TTP R lumbar paraspinals, R SIJ    Joint mobility hypomobility R SIJ, L1-S1    Posture: LATERAL SHIFT    Gait: antalgic on R, guarded trunk posturing throughout gait cycle    Special tests: + spondy test, + gowers sign      Treatment provided:REVIEW OF POC AND GOALS WITH PATIENT, ALL QUESTIONS ANSWERED, PATIENT EDUCATION, and THERAPEUTIC EXERCISE  (thomas  stretch HEP provided)  *PT added Heel Lift to L shoe which increased R LBP, hip stretching exacerbated pain, MFR to lumbar paraspinals exacerbated pain          ASSESSMENT    Impression: Pt presents with acute and severe low back pain with known L5 pars defect and signs and symptoms consistent with lumbar instability. He is unable to tolerate standing, walking, sitting, prone, and supine positions today without severe LBP. He also has a audible lumbar cavitation with R hip flexion which is reproducible and pain provoking. He does demo mild-moderate hip tightness, and PT provided and reviewed thomas stretch for home while he awaits neurosurgical consult and additional imaging. He is not appropriate for skilled PT interventions until cleared to participate by neurosurgeon.     **PT recs flexion/extension x-ray series to r/o lumbar instability    PLAN    Patient will attend PT Evaluation only. He was encouraged to call back after neurosurgical consult.        Evaluation complexity:   Personal factors impacting POC:  none   Co-morbidities impacting POC:  none  Complexity of physical exam: INCLUDING MUSCULOSKELETAL SYSTEM (POSTURE, ROM, STRENGTH, HEIGHT/WEIGHT) and INCLUDING ACTIVITY/MOBILITY RESTRICTIONS   Clinical Presentation: STABLE   Evaluation Complexity: LOW-HISTORY 0, EXAMINATION 1-2, STABLE  PRESENTATION        Total Session Time 40 and Untimed code minutes 40        Intervention minutes: EVALUATION 40 minutes    Shirlean Schlein, PT  01/26/2023, 16:14                Certification:    From:______  Through:_________    I certify the need for these services furnished under this plan of treatment and while under my care.    Referring Provider Signature: _______________     Date : _____________________    Printed name of Referring Provider: ___________________________________________

## 2023-02-01 IMAGING — DX XRAY LUMBAR SPINE COMPLETE 6 VIEWS
1 series · 3 of 3 positions shown · non-contrast
Comparison: MRI dated 01/13/2023. Lumbar spine x-rays dated 12/30/2022.

﻿EXAM:  [id number]      XRAY LUMBAR SPINE [DATE] VIEWS INCLUDING FLEXION EXTENSION LATERAL VIEWS:
INDICATION: Low back pain.  History of lymphoma.

[Series 1: lateral · 0.14mm/px · 3 of 3 slices shown]
[im 1/3]
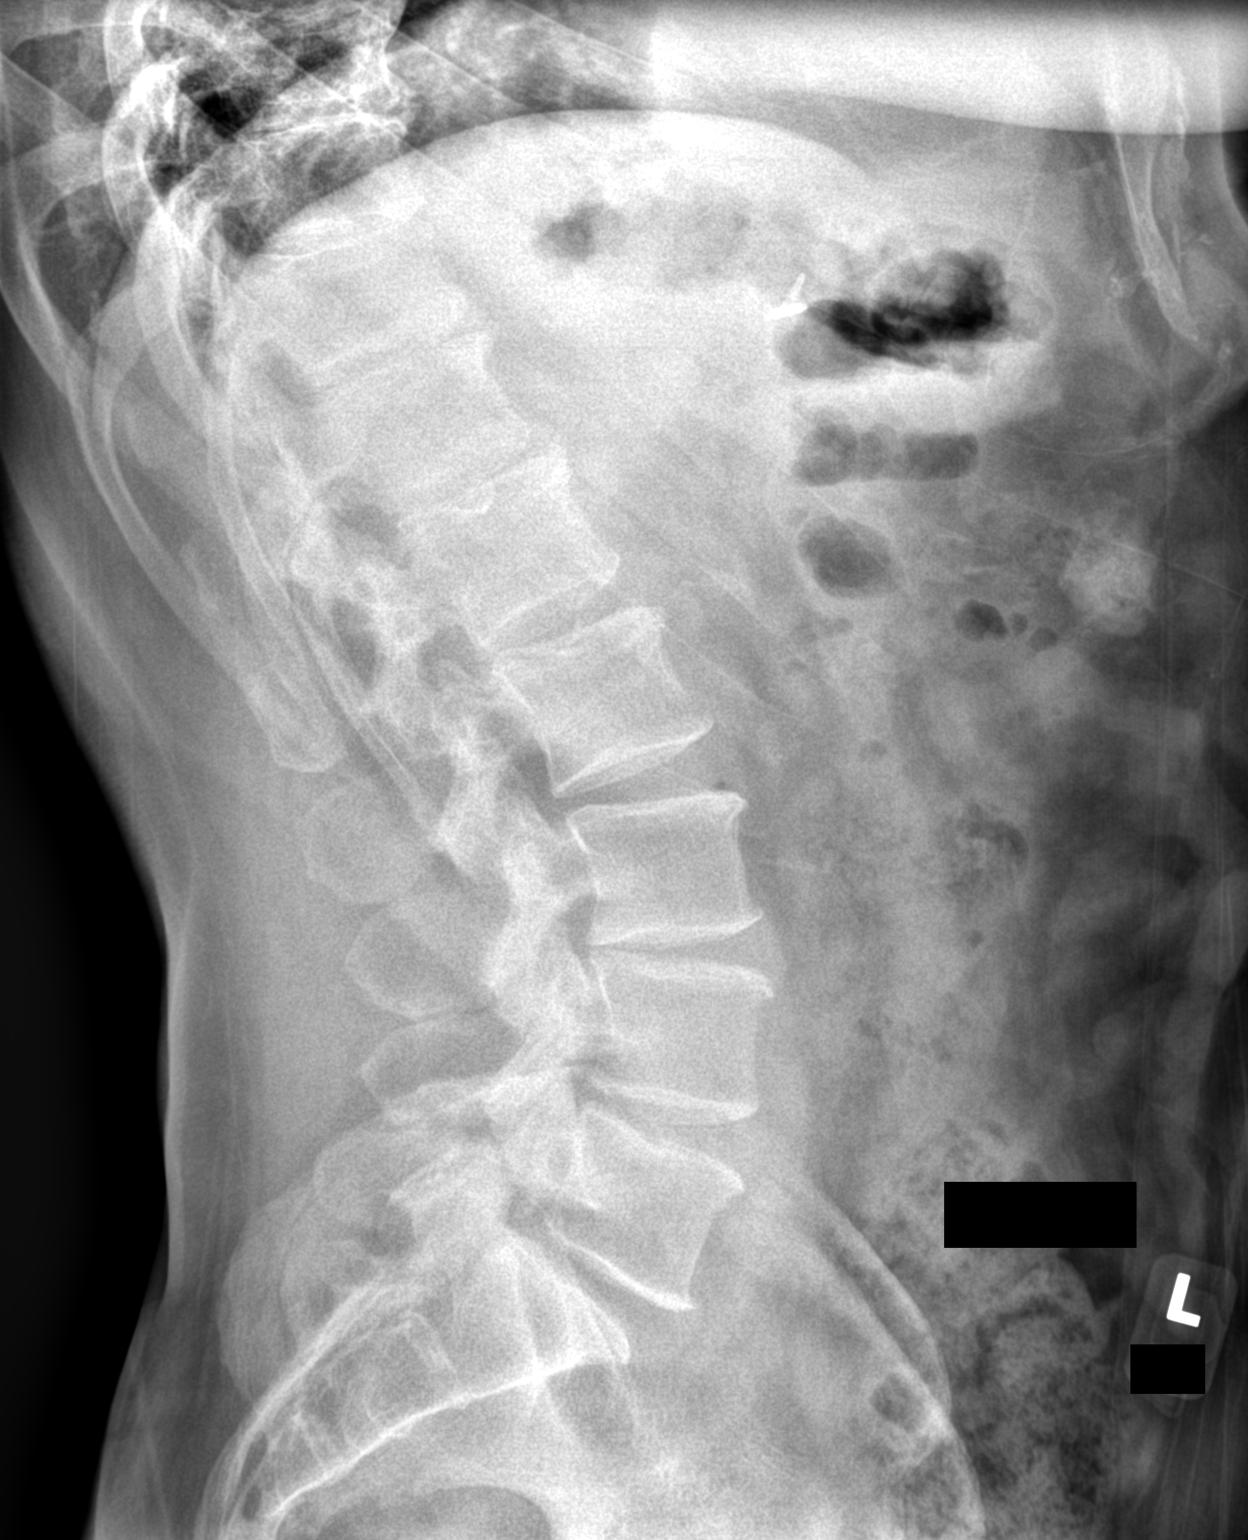
[im 2/3]
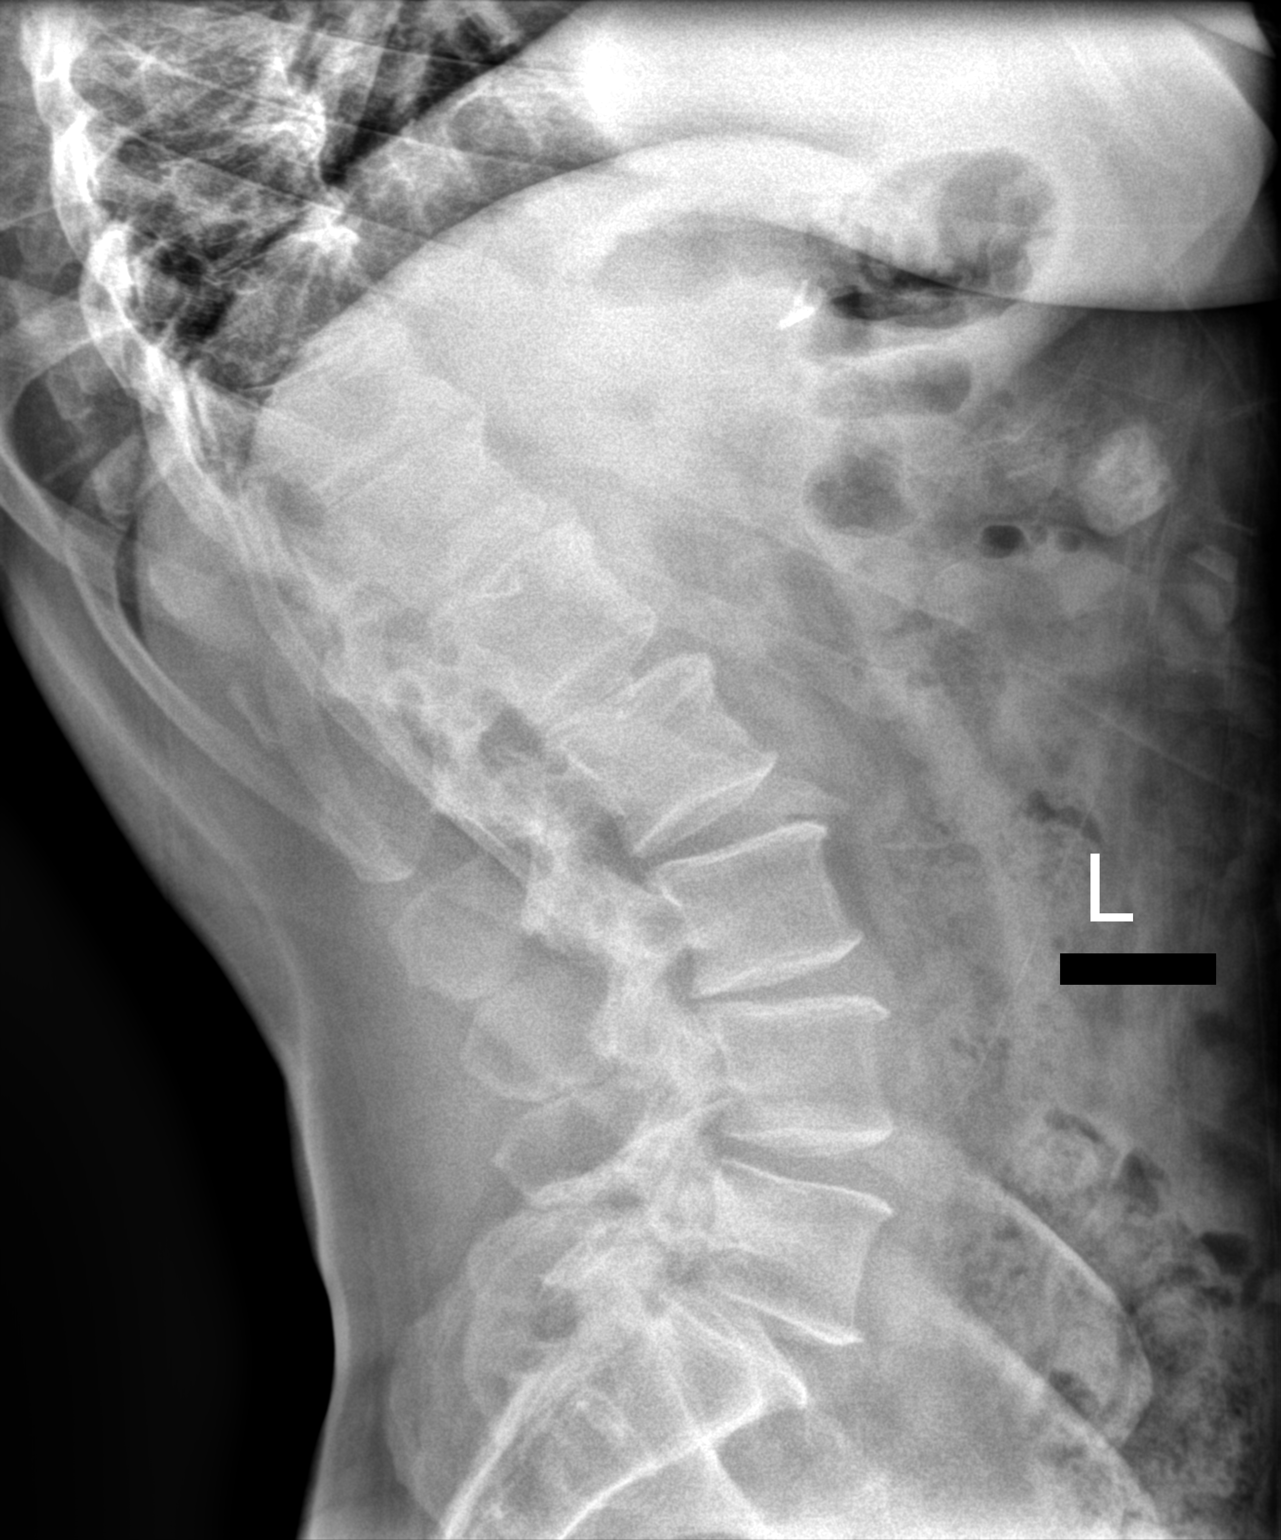
[im 3/3]
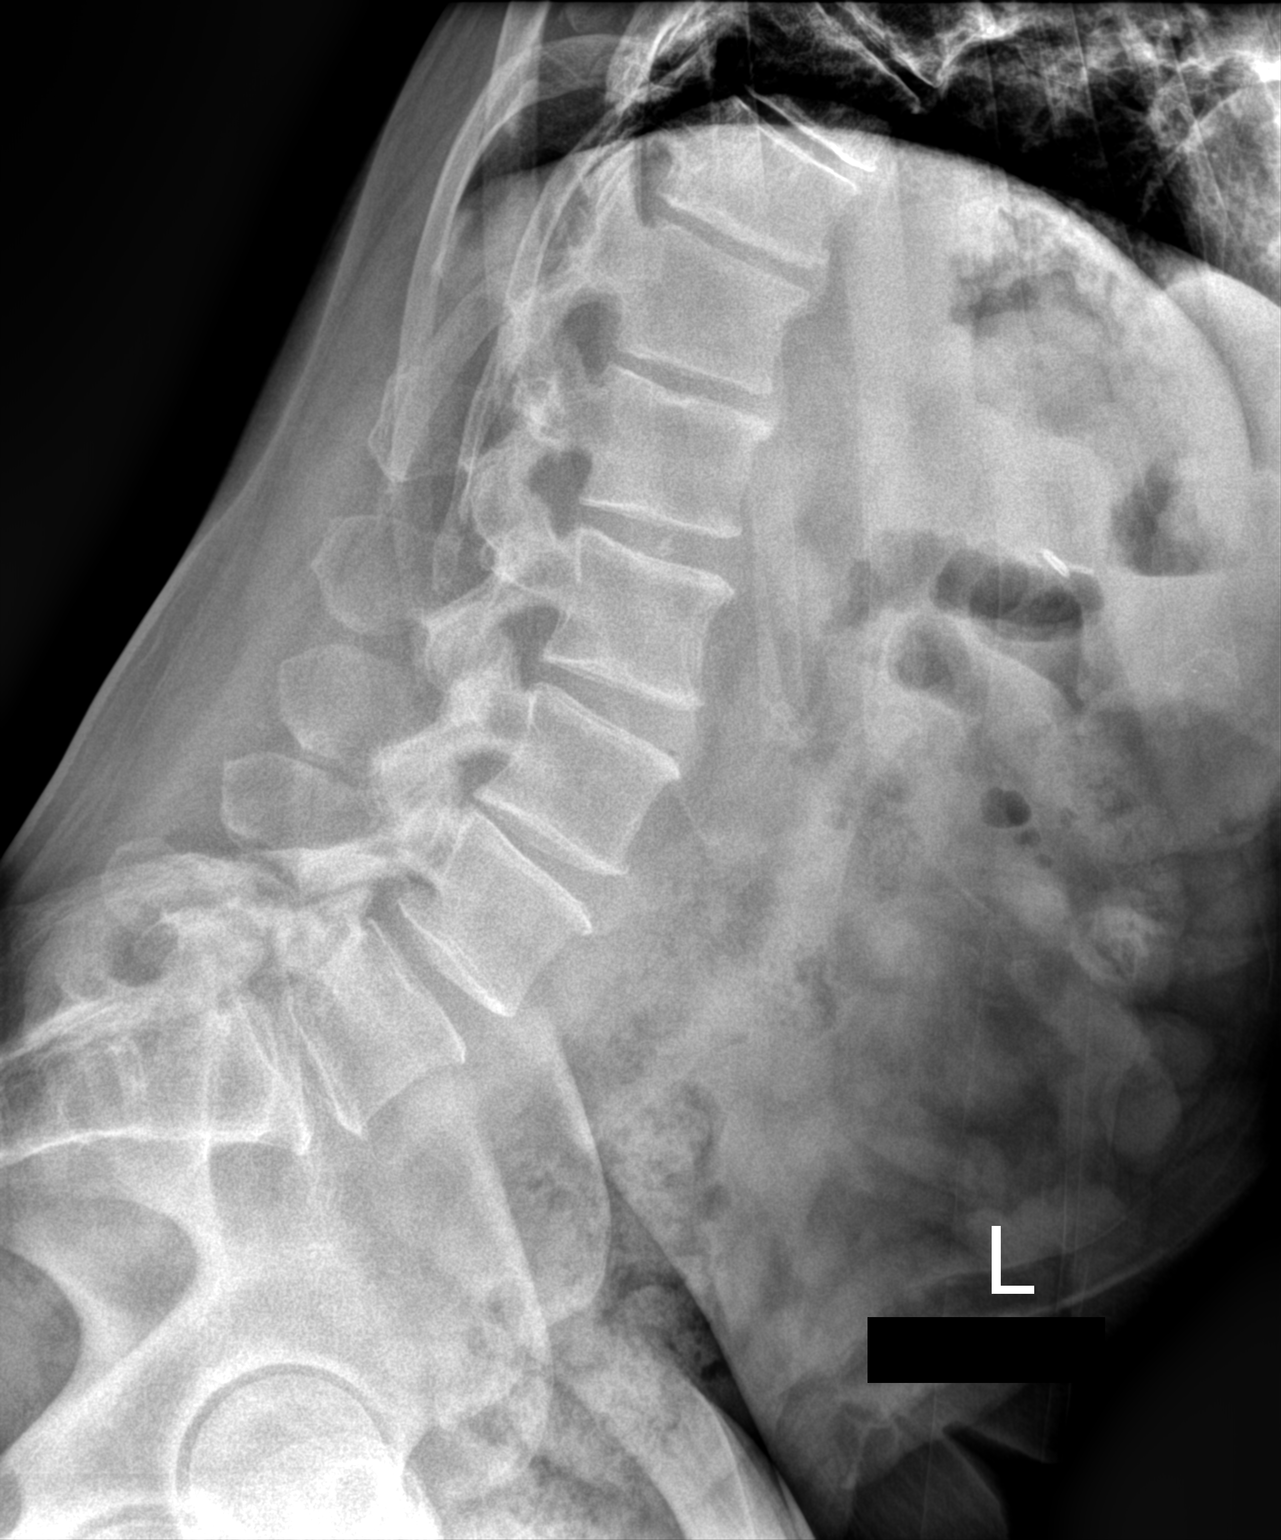

[3 of 3 positions shown; findings below may reference images not displayed]

FINDINGS: No acute bony lesions of lumbar vertebrae are seen.  Significant degenerative disc changes at L3-4, L4-5 and L5-S1 levels.  Minimal first-degree spondylolisthesis of L5 on S1 is noted with stable alignment in the lateral flexion extension views.
IMPRESSION: Multilevel degenerative disc changes with first-degree spondylolisthesis at L5-S1 level.  Stable alignment on the lateral flexion extension views.

## 2023-03-01 ENCOUNTER — Ambulatory Visit (INDEPENDENT_AMBULATORY_CARE_PROVIDER_SITE_OTHER): Payer: 59 | Admitting: Neurological Surgery

## 2023-04-30 ENCOUNTER — Encounter (HOSPITAL_COMMUNITY): Payer: Self-pay | Admitting: Internal Medicine

## 2023-08-02 ENCOUNTER — Other Ambulatory Visit: Payer: Self-pay

## 2023-08-02 ENCOUNTER — Ambulatory Visit: Payer: 59 | Attending: Neurological Surgery | Admitting: Neurological Surgery

## 2023-08-02 ENCOUNTER — Encounter (INDEPENDENT_AMBULATORY_CARE_PROVIDER_SITE_OTHER): Payer: Self-pay | Admitting: Neurological Surgery

## 2023-08-02 VITALS — BP 119/76 | HR 84 | Temp 97.9°F | Ht 69.0 in | Wt 212.3 lb

## 2023-08-02 DIAGNOSIS — M545 Low back pain, unspecified: Secondary | ICD-10-CM | POA: Insufficient documentation

## 2023-08-02 DIAGNOSIS — M4310 Spondylolisthesis, site unspecified: Secondary | ICD-10-CM | POA: Insufficient documentation

## 2023-08-02 DIAGNOSIS — M47896 Other spondylosis, lumbar region: Secondary | ICD-10-CM

## 2023-08-02 NOTE — Progress Notes (Signed)
 NEUROSURGERY[facility]  [address]  [place] [address]  Operated by [facility]  History and Physical    Name: Jason Huffman MRN:  [id number]   Date: 08/02/2023 DOB:  [birth year] (47 y.o.)         Referring Provider:  Elvera Bicker, FNP-BC  [address]  [place],  [place] [address]       Gender: male  Handedness: Right handed  Marital Status: Married   Job Title (or Former Job): bus driver- [employer] Complaint:   Chief Complaint   Patient presents with    New Patient     History is provided by patient and spouse    History of Present Illness  This is a 48 yo male with back pain for years with worsening 9/24 after he hyperextended while carrying a tire.  During workup he was noted to have a pars defect.  He has constant back pain with episodic numbness in the legs below the knees.    His pain increases with hyperextension and bending at the waist.  He feels somewhat better with Motrin but isn't supposed to take it since he is on Eliquis.  No bowel or bladder issues.  Patient denies PT or injections.    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  History of Hodgkin's lymphoma treated in 2023.  Patient currently in remission and follows with his oncologist every 3-6 months.  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    Past History  Current Outpatient Medications   Medication Sig    allopurinoL (ZYLOPRIM) 300 mg Oral Tablet Take 1 Tablet (300 mg total) by mouth Once a day for 4 days    amLODIPine (NORVASC) 5 mg Oral Tablet Take 2 Tablets (10 mg total) by mouth Once a day (Patient not taking: Reported on 08/02/2023)    cyclobenzaprine (FLEXERIL) 10 mg Oral Tablet TAKE 1 TABLET BY MOUTH THREE TIMES A DAY AS NEEDED    doxazosin (CARDURA) 2 mg Oral Tablet Take 1 Tablet (2 mg total) by mouth Once a day    doxepin (SINEQUAN) 100 mg Oral Capsule Take 1 Capsule (100 mg total) by mouth Every night    doxepin (SINEQUAN) 50 mg Oral Capsule Take 1 Capsule (50  mg total) by mouth Every night    ELIQUIS 5 mg Oral Tablet Take 1 Tablet (5 mg total) by mouth Twice daily    gabapentin (NEURONTIN) 100 mg Oral Capsule Take 1 Capsule (100 mg total) by mouth Twice daily    hydrOXYzine pamoate (VISTARIL) 25 mg Oral Capsule TAKE 1 CAPSULE BY MOUTH AT BEDTIME    Ibuprofen (MOTRIN) 600 mg Oral Tablet Take 1 Tablet (600 mg total) by mouth Four times a day as needed for Pain    naproxen (NAPROSYN) 500 mg Oral Tablet Take 1 Tablet (500 mg total) by mouth Once per day as needed for Pain    ondansetron (ZOFRAN) 8 mg Oral Tablet Take 1 Tablet (8 mg total) by mouth Every 8 hours as needed for Nausea/Vomiting Indications: prevent nausea and vomiting from cancer chemotherapy    pantoprazole (PROTONIX) 40 mg Oral Tablet, Delayed Release (E.C.) Take 1 Tablet (40 mg total) by mouth Twice daily    prazosin (MINIPRESS) 2 mg Oral Capsule Take 1 Capsule (2 mg total) by mouth Every night (Patient not taking: Reported on 08/02/2023)    prochlorperazine (COMPAZINE) 10 mg Oral Tablet Take 1  Tablet (10 mg total) by mouth Every 6 hours as needed for Nausea/Vomiting Indications: prevent nausea and vomiting from cancer chemotherapy    sumatriptan succinate (IMITREX) 100 mg Oral Tablet TAKE 1 TABLET AT ONSET OF HEADACHE MAY REPEAT DOSE IN 2HRS. NO MORE THAN 2/24HRS     No Known Allergies  Past Medical History:   Diagnosis Date    Hodgkin lymphoma (CMS HCC)          Past Surgical History:   Procedure Laterality Date    FINGER SURGERY Right     HX LAP CHOLECYSTECTOMY      MEDIPORT INSERTION, SINGLE Right          Family History  Family Medical History:       Problem Relation (Age of Onset)    Coronary Artery Disease Mother, Father            Social History  Social History     Socioeconomic History    Marital status: Married    Number of children: 2   Occupational History    Occupation: School bus driver   Tobacco Use    Smoking status: Former     Current packs/day: 0.00     Types: Cigarettes     Quit date: 2007      Years since quitting: 18.1    Smokeless tobacco: Never   Substance and Sexual Activity    Alcohol use: Never    Drug use: Never     Review of Systems  Other than ROS in the HPI, all other systems were negative.    Examination  BP 119/76   Pulse 84   Temp 36.6 C (97.9 F) (Thermal Scan)   Ht 1.753 m (5\' 9" )   Wt 96.3 kg (212 lb 4.9 oz)   BMI 31.35 kg/m         Constitutional  General appearance: Normal  HNNT: Normal  Eyes: Ophthalmic exam of optic discs and posterior segments: Normal  Cardiovascular:   Peripheral vascular system: Normal  Musculoskeletal  Gait and Station: : Normal  Muscle strength (upper extremities): : Normal  Muscle strength (lower extremities): : Normal  Muscle tone (upper extremities): : Normal  Muscle tone (lower extremities): : Normal  Sensation: Normal  Deep tendon reflexes upper and lower extremities: Normal  Coordination: Normal  Hoffman's reflex: Left: negative Right: negative  Ankle clonus:Left : Not present Right Not present  Babinski: Left: absent Right: absent  Straight leg raise: Left: negative Right: negative  Crossed Straight leg raise: Left: negative Right: negative  Patrick's sign: Left: negative Right: negative  Musculoskeletal tenderness: positive    Neurological  Orientation: Normal  Recent and remote memory: Normal  Attention span and concentration: Normal  Language: Normal  Fund of knowledge: Normal  Cranial Nerves  2nd: Normal  3rd,4th,6th: Normal  5th: Normal  7th: Normal  8th: Normal  9th: Normal  11th: Normal  12th: Normal      Data reviewed  1. MRI of the Lumbar Spine performed on 01/13/23 on synapse and it shows L5-S1 spondy with pars defect.     Discussions with other providers:     Diagnosis  Assessment/Plan   1. Acute low back pain    2. Lumbar back pain    3. Pars defect with spondylolisthesis        Recommendations  No orders of the defined types were placed in this encounter.  The patient would like to try undergoing an eval/treatment at a pain clinic.  Order placed to be seen at [facility]. [facility] (complex spine surgeons in our department) if pain clinic doesn't help and he would like to pursue surgery.  The patient was seen as a shared visit with the co-signing faculty.   Tonie Griffith, PA-C    I personally saw and evaluated the patient as part of a shared service with an APP.    My substantive findings are:  MDM (complete) lumbar spondylosis, plan as above    I independently of the APP spent a total of (30) minutes in direct/indirect care of this patient including initial evaluation, review of laboratory, radiology, diagnostic studies, review of medical record, order entry and coordination of care.
